# Patient Record
Sex: Female | Born: 1960 | Race: Black or African American | Hispanic: No | Marital: Married | Smoking: Never smoker
Health system: Southern US, Community
[De-identification: ages and names within clinical notes are randomized; demographics above are authoritative.]

## PROBLEM LIST (undated history)

## (undated) DIAGNOSIS — E119 Type 2 diabetes mellitus without complications: Secondary | ICD-10-CM

## (undated) DIAGNOSIS — E669 Obesity, unspecified: Secondary | ICD-10-CM

## (undated) DIAGNOSIS — G43909 Migraine, unspecified, not intractable, without status migrainosus: Secondary | ICD-10-CM

## (undated) DIAGNOSIS — F338 Other recurrent depressive disorders: Secondary | ICD-10-CM

## (undated) DIAGNOSIS — M3500 Sicca syndrome, unspecified: Secondary | ICD-10-CM

## (undated) DIAGNOSIS — K589 Irritable bowel syndrome without diarrhea: Secondary | ICD-10-CM

## (undated) DIAGNOSIS — I10 Essential (primary) hypertension: Secondary | ICD-10-CM

## (undated) DIAGNOSIS — G4700 Insomnia, unspecified: Secondary | ICD-10-CM

## (undated) DIAGNOSIS — G35 Multiple sclerosis: Secondary | ICD-10-CM

## (undated) DIAGNOSIS — E785 Hyperlipidemia, unspecified: Secondary | ICD-10-CM

## (undated) DIAGNOSIS — M5030 Other cervical disc degeneration, unspecified cervical region: Secondary | ICD-10-CM

## (undated) HISTORY — PX: LAPAROSCOPIC CHOLECYSTECTOMY: SUR755

## (undated) HISTORY — PX: BREAST EXCISIONAL BIOPSY: SUR124

## (undated) HISTORY — DX: Migraine, unspecified, not intractable, without status migrainosus: G43.909

## (undated) HISTORY — DX: Other recurrent depressive disorders: F33.8

## (undated) HISTORY — PX: AXILLARY LYMPH NODE BIOPSY: SHX5737

## (undated) HISTORY — DX: Obesity, unspecified: E66.9

## (undated) HISTORY — DX: Insomnia, unspecified: G47.00

## (undated) HISTORY — DX: Hyperlipidemia, unspecified: E78.5

## (undated) HISTORY — PX: NECK SURGERY: SHX720

## (undated) HISTORY — PX: HYPERPLASIA TISSUE EXCISION: SHX5201

## (undated) HISTORY — DX: Type 2 diabetes mellitus without complications: E11.9

## (undated) HISTORY — DX: Irritable bowel syndrome, unspecified: K58.9

---

## 1998-11-20 ENCOUNTER — Observation Stay (HOSPITAL_COMMUNITY): Admission: EM | Admit: 1998-11-20 | Discharge: 1998-11-21 | Payer: Self-pay | Admitting: Emergency Medicine

## 2003-01-14 ENCOUNTER — Encounter: Payer: Self-pay | Admitting: Emergency Medicine

## 2003-01-14 ENCOUNTER — Emergency Department (HOSPITAL_COMMUNITY): Admission: EM | Admit: 2003-01-14 | Discharge: 2003-01-14 | Payer: Self-pay | Admitting: Emergency Medicine

## 2003-04-14 ENCOUNTER — Encounter: Payer: Self-pay | Admitting: Specialist

## 2003-04-20 ENCOUNTER — Observation Stay (HOSPITAL_COMMUNITY): Admission: RE | Admit: 2003-04-20 | Discharge: 2003-04-21 | Payer: Self-pay | Admitting: Specialist

## 2003-04-20 ENCOUNTER — Encounter: Payer: Self-pay | Admitting: Specialist

## 2005-11-16 ENCOUNTER — Other Ambulatory Visit: Admission: RE | Admit: 2005-11-16 | Discharge: 2005-11-16 | Payer: Self-pay | Admitting: Cardiology

## 2006-06-07 ENCOUNTER — Ambulatory Visit (HOSPITAL_COMMUNITY): Admission: RE | Admit: 2006-06-07 | Discharge: 2006-06-07 | Payer: Self-pay | Admitting: *Deleted

## 2006-06-07 ENCOUNTER — Encounter (INDEPENDENT_AMBULATORY_CARE_PROVIDER_SITE_OTHER): Payer: Self-pay | Admitting: Specialist

## 2007-03-07 ENCOUNTER — Other Ambulatory Visit: Admission: RE | Admit: 2007-03-07 | Discharge: 2007-03-07 | Payer: Self-pay | Admitting: Internal Medicine

## 2008-12-23 ENCOUNTER — Other Ambulatory Visit: Admission: RE | Admit: 2008-12-23 | Discharge: 2008-12-23 | Payer: Self-pay | Admitting: Internal Medicine

## 2010-10-19 ENCOUNTER — Other Ambulatory Visit
Admission: RE | Admit: 2010-10-19 | Discharge: 2010-10-19 | Payer: Self-pay | Source: Home / Self Care | Admitting: Family Medicine

## 2011-03-23 NOTE — Op Note (Signed)
NAMETORAH, PINNOCK                         ACCOUNT NO.:  [id number]   MEDICAL RECORD NO.:  [id number]                   PATIENT TYPE:  OBV   LOCATION:  2550                                 FACILITY:  [facility]   PHYSICIAN:  Kerrin Champagne, M.D.                DATE OF BIRTH:  May 14, 1961   DATE OF PROCEDURE:  04/20/2003  DATE OF DISCHARGE:                                 OPERATIVE REPORT   PREOPERATIVE DIAGNOSIS:  Central and left-sided herniated nucleus pulposus,  C5-C6 and C6-C7.   POSTOPERATIVE DIAGNOSIS:  Central and left-sided herniated nucleus pulposus,  C5-C6 and C6-C7.   PROCEDURE:  Anterior cervical diskectomy and fusion at C5-C6 and C6-C7  utilizing right iliac crest bone graft harvested through a separate  incision, internal fixation using a DePuy 43-mm locking plate with 60-AV  screws.  Microscope used during the procedure for diskectomy and excision of  herniated nucleus pulposus at C6-C7 and C5-C6.   SURGEON:  Kerrin Champagne, M.D.   ASSISTANT:  Wende Neighbors, P.A.   ANESTHESIA:  GOT, Dr. Randa Evens.   ESTIMATED BLOOD LOSS:  75 cc.   DRAIN:  TLS 10-French, left neck, Foley to straight drain.   BRIEF CLINICAL HISTORY:  This patient is a 50 year old right-handed female  who has been experiencing discomfort in her neck and radiation into her left  arm since early March of this year.  She has been taking anti-inflammatory  agents and presented to the emergency room with complaints of increasing  neck and radiation into the left arm.  She has undergone extensive  conservative management and has required use of narcotic medications to  relieve her pain.  She has undergone evaluation with MRI study which  demonstrates disk protrusion at C5-C6 and at C6-C7, with evidence of slight  canal stenosis and flattening of the ventral cord at the C5-C6 level, AP  diameter at 8 mm, as well as disk protrusion of the C6-C7 level with  extension to the left side.  The patient is  brought to the operating room to  undergo anterior diskectomy with fusion with right-sided bone graft and  internal fixation.   INTRAOPERATIVE FINDINGS:  The patient was found to have a central and left-  sided disk herniation at the C6-C7 with diffuse disk protrusion at C5-C6,  primarily uncovertebral spurring causing impingement on the cervical canal  at this segment.  She underwent decompression of both segments without  difficulty.   DESCRIPTION OF PROCEDURE:  After adequate general anesthesia, the patient in  the beach chair position, standard preoperative antibiotic, 5 pounds  cervical halter traction, and a transverse roll at the level of the shoulder  blades, with the neck in slight extension.  The right iliac crest with a  bump under the right buttock.  Draped in the usual manner following DuraPrep  to the neck and right iliac crest.  She was given standard  preoperative  antibiotics.  The incision in the anterior left neck at the expected C6-C7  level 2 fingerbreadths above the medial border of the clavicle in this  patient with an apparent short neck.  The incision made in line with the  skin crease.  Approximately 3-3.5 inches in length through the skin and  subcutaneous layers after infiltration with Marcaine 0.5% with 1:200,000  epinephrine.  Incision carried sharply to the preplatysma layer fascia.  This was then incised in line with the skin incision and continued through  the platysma layer through the deep fascial layer.  A Metzenbaum scissors  then used to carefully spread soft tissues and fascial layers to the  interval between the trachea and esophagus laterally.  Finger dissection  then used to carefully spread between the trachea and esophagus medially,  carotid sheath laterally to the anterior aspect of the cervical spine.  Hand-  held Cloward inserted, and the anterior cervical spine identified.  The  prevertebral fascia incised along the medial border of the  longus colli  muscle using electrocautery, then across the midline using Barista.   Spinal needles were then placed at the expected C5-C6 and C6-C7 levels.  Intraoperative C-arm fluoroscopy used to localize this area and demonstrated  the needles at C6-C7 and at C7-T1.  So, the lower needle was removed under  direct observation and replaced at the next level up, the expected C5-C6  level, and this was verified on C-arm fluoroscopy.  With this done, hand-  held Clowards were continued to be used for anterior exposure.  The lower  needle was then removed, and a small portion of the disk excised using a 15  blade scalpel and pituitary rongeur.  Then, the upper disk also had removal  of needle and then a small portion of the disk excised for continued  identification at this level.  With this then, the medial border of the  longus colli muscle was carefully freed up bilaterally using a Key elevator  and Bovie electrocautery used to control bleeding.  A McCullough retractor  then with an articulated portion to the self-distraction portion of the  system was then inserted, and the medial foot placed beneath the medial  border of the longus colli muscle on both sides to allow for exposure of the  anterior aspect of the cervical spine and protection of the esophagus.  This  was done first at the C6-C7 level.  A 15 blade scalp used to excise the  remaining anterior annular fibers and then pituitary rongeurs used to  further remove disk from the anterior two thirds of the disk space.  A 14-mm  screws were then inserted into the vertebral body of C6 and C7, distraction  obtained across the intervertebral disk space.  Under loupe magnification,  then the disk space was debrided further of loose degenerative disk material  as well as the cartilaginous endplates of the inferior aspect of C6,  superior aspect of C7.  This was debrided back to the posterior aspect of the disk space.  The  operating room microscope draped sterilely and brought  into the field.  Under direct visualization, then the posterior aspect of  the disk space was debrided of annular disk material.  The posterior lip  osteophytes were resected using 1 and 2-mm Kerrisons.  The posterior  longitudinal ligament identified centrally with disk material extending into  the left side.  This was resected and then the posterior longitudinal  ligament resected.  Disk  material found within the spinal canal on the left  side and central.  This was excised using pituitary rongeurs as well as  titanium nerve hook.  Once this was completed, then the spinal canal was  carefully examined, demonstrated no further impingement on the left side of  the cord or centrally, and the neural foramen appeared to be wide open.  Height of the intervertebral disk space was sounded using an 8-mm sounder.  Depth measuring 14 mm. Bone graft was harvested through a separate incision  over the right iliac crest using a 10 blade scalpel and with that an  approximately 3.5-4 inch incision was made through the skin and subcutaneous  layers, electrocautery used to control bleeders.  This was carried down to  the anterolateral aspect of the iliac crest.  Self-retaining retractor  inserted.  Subperiosteal dissection carried both medial and lateral to the  iliac crest laterally.  The Cobb elevators left in place.  A dual  oscillating saw was then used to cut a portion of the bone graft,  tricortical iliac crest bone graft, using a 9-mm graft size.  This was then  divided across its base, carefully shaped to the dimensions of the  intervertebral disk space.  A J-graft was used at the C6-C7 level.  The  graft depth approximately 13-14 mm and height of approximately 9 mm.  After  carefully tethering this to the dimension of the intervertebral disk space,  the endplates carefully examined, there was no loose material within the  disk space, the  graft placed over the disk space and packed into placed  without difficulty.  Distraction then released across the intervertebral  disk space and the screw post removed at the C7 level.  The retractor was  then moved superiorly beneath the medial border of the longus colli muscle,  replaced at the foot of the retractor on each side.   Hand-held Cloward then used for traction of the superior structures to allow  for exposure of the vertebral body of C5.  Carefully, the soft tissues over  the anterior aspect of the cervical spine were debrided using electrocautery  at C5, C6 and C7 to allow for exposure of the bone here.  Then 14-mm screw  posts were then inserted in the C5 vertebral body parallel to that of  C6,  and distraction obtained across the intervertebral disk space.   The anterior aspect of the disk space was then debrided using pituitary  rongeurs and microcurettes, debriding the cartilaginous endplates both superiorly and inferiorly at the C5-C6 level.  High-speed bur used to  similarly debride the endplates back to the posterior aspect of the disk  space.  The operating room microscope used for the majority of this portion  of the procedure as it was already in place as well.   Posterior annular tissue resected using pituitary rongeurs as well as 1 and  2-mm Kerrisons.  Posterior lip osteophytes resected both superiorly and  inferiorly using 1 and 2-mm Kerrisons and foraminotomy performed on the left  side.  With this, the disk space appeared to be well decompressed.  Irrigation was performed.  The height of the intervertebral disk space was  measuring, measuring about 8 mm, and the depth approximately 18 mm.  Additional bone graft was harvested through the right iliac crest incision  site by continuing the incision more posteriorly.  Using again the 9-mm with  dual oscillating blade, this was cut, then divided across the base with a  1/4-inch curved osteotome.  Bone wax than  applied to the cancellous bone  surfaces at the bone graft harvest site for hemostasis purposes.  This area  then packed.  Bone graft had a height of approximately 9 mm.  This was  carefully trimmed and keyed to allow for insertion at the disk space with a  correct fit.  Depth of the bone graft measuring about 13-14 mm.  The graft  carefully debrided of any soft tissue attachments.  The disk space then  carefully irrigated, graft placed over the disk space, impacted into place.  Care was taken to assure there was no loose debris within the disk space  that could be retropulsed with insertion of the graft.  The graft subset  approximately 1-2 mm at this level.  With this, then distraction was  released across the intervertebral disk space, 5 pounds cervical halter  traction released, screw posts removed at both C5 and C6, bone wax applied  to the bleeding screw post holes.  Careful hemostasis obtained using a  bipolar electrocautery.  A single thyroid branch artery at the initiation of  the case actually had been ligated. With exposure, this continued to do well  throughout the case.   High-speed bur was then used to remove the anterior lip osteophytes at C5-C6  and C6-C7, carefully smoothing the anterior aspect of the cervical spine to  allow for the plate to anneal carefully against the anterior bony surfaces.  A 43-mm length plate was chosen bridging each of the disk spaces and yet not  impinging on either the disk space above or below the fusion site.  With  this, the plate was carefully aligned in the sagittal plane then pinned in  place using the temporary fixation pins superiorly and inferiorly.  Drill  holes were then first placed at the C7 level, left side, then the right  side, following placement of 14-mm screw on the right side.  The left side  was drilled and then similarly placed.  Then at the C6 level, the right and  then left side also similarly drilled in this fashion, and  the appropriate sized 14-mm screw placed.  These all obtained excellent purchase, and then  at the C5 level, similar drilling was performed and then 14-mm screws used  for fixation of the plate at the C5 level.  With this, then the fixation  pins were removed.  The locking nuts were then carefully turned to lock the  screws into the plate.  Irrigation was performed, and inspection of the  esophagus demonstrated no abnormality.  C-arm images were obtained in the  lateral plane demonstrating the bone plugs and plate in good position and  alignment.  No evidence of retropulsion of bone and no evidence of screw  extension into the spinal canal.  With this, inspection of the neck, there  was no active bleeding.  However, it was felt that a 10-French drain was  appropriate to prevent any development of hematoma here.  This was placed in  the depths of the incision exiting inferior to the incision over the  anterior neck.  The platysma layer was approximated with interrupted 3-0  Vicryl suture, deep subcutaneous layers with interrupted 3-0 Vicryl suture,  and the skin closed with a running subcuticular stitch of 4-0 Vicryl.  The  right iliac crest bone graft harvest site was carefully irrigated, bone wax  again applied to the bleeding cancellous bone surface, excess bone wax  removed, and Gelfoam  applied.  The periosteal layer, the abdominal fascia,  and the anterior thigh fascia then approximated over the iliac crest with  interrupted #1 Vicryl suture, the deep subcutaneous layers approximated with  interrupted #1 and 0 Vicryl sutures, more superficial layers with  interrupted 2-0 Vicryl sutures, and the skin closed with a running  subcuticular stitch of 4-0 Vicryl.  Tincture of Benzoin and Steri-Strips  applied, 4 x 4's affixed to the skin with Hypofix tape.  The 10-French drain  in the neck was sewn in place using a 4-0 nylon suture.  Dressing then  fixed to the neck following application of  Steri-Strips and Tincture of  Benzoin to this area, 4 x 4 affixed to the skin with Hypofix tape.  Phil[place] collar was then applied.  The patient then reactivated,  extubated, and returned to the recovery room in satisfactory condition.  All  instrument and sponge counts were correct.                                               Kerrin Champagne, M.D.    JEN/MEDQ  D:  04/20/2003  T:  04/20/2003  Job:  [id number]

## 2011-03-23 NOTE — Op Note (Signed)
Linda Beltran, Linda Beltran               ACCOUNT NO.:  [id number]   MEDICAL RECORD NO.:  [id number]          PATIENT TYPE:  AMB   LOCATION:  DAY                          FACILITY:  [facility]   PHYSICIAN:  Alfonse Ras, MD   DATE OF BIRTH:  [birth year]   DATE OF PROCEDURE:  06/07/2006  DATE OF DISCHARGE:                                 OPERATIVE REPORT   PREOPERATIVE DIAGNOSIS:  Left axillary lymph node enlargement.   POSTOPERATIVE DIAGNOSIS:  Left axillary lymph node enlargement.   PROCEDURE:  Left axillary lymph node biopsy.   ANESTHESIA:  Local MAC.   DESCRIPTION:  The patient was taken to the operating room and placed in a  supine position. After the left axilla was prepped and draped in normal  sterile fashion, I injected 1% lidocaine local anesthesia into the area  overlying the palpable adenopathy. A transverse incision was made. I  dissected down onto enlarged lymph nodes. These were incised in their  entirety and sent for pathologic evaluation. Adequate hemostasis was  ensured, and the skin was closed with subcutaneous 4-0 Monocryl. Steri-  Strips and sterile dressing were applied. The patient tolerated the  procedure well and went to PACU in good condition.      Alfonse Ras, MD  Electronically Signed     KRE/MEDQ  D:  06/07/2006  T:  06/08/2006  Job:  [id number]

## 2013-11-27 ENCOUNTER — Other Ambulatory Visit (HOSPITAL_COMMUNITY)
Admission: RE | Admit: 2013-11-27 | Discharge: 2013-11-27 | Disposition: A | Discharge status: Home / Self Care | Payer: Commercial Indemnity | Source: Ambulatory Visit | Attending: Family Medicine | Admitting: Family Medicine

## 2013-11-27 ENCOUNTER — Other Ambulatory Visit: Payer: Self-pay | Admitting: Family Medicine

## 2013-11-27 DIAGNOSIS — Z1151 Encounter for screening for human papillomavirus (HPV): Secondary | ICD-10-CM | POA: Insufficient documentation

## 2013-11-27 DIAGNOSIS — Z124 Encounter for screening for malignant neoplasm of cervix: Secondary | ICD-10-CM | POA: Insufficient documentation

## 2017-08-23 ENCOUNTER — Other Ambulatory Visit (HOSPITAL_COMMUNITY)
Admission: RE | Admit: 2017-08-23 | Discharge: 2017-08-23 | Disposition: A | Discharge status: Home / Self Care | Payer: Managed Care, Other (non HMO) | Source: Ambulatory Visit | Attending: Family Medicine | Admitting: Family Medicine

## 2017-08-23 ENCOUNTER — Other Ambulatory Visit: Payer: Self-pay | Admitting: Family Medicine

## 2017-08-23 ENCOUNTER — Encounter: Payer: Self-pay | Admitting: Neurology

## 2017-08-23 DIAGNOSIS — Z01411 Encounter for gynecological examination (general) (routine) with abnormal findings: Secondary | ICD-10-CM | POA: Diagnosis present

## 2017-08-27 LAB — CYTOLOGY - PAP
Diagnosis: NEGATIVE
HPV: NOT DETECTED

## 2017-09-02 ENCOUNTER — Other Ambulatory Visit: Payer: Self-pay | Admitting: Family Medicine

## 2017-09-02 DIAGNOSIS — Z1231 Encounter for screening mammogram for malignant neoplasm of breast: Secondary | ICD-10-CM

## 2017-09-20 ENCOUNTER — Encounter: Payer: Self-pay | Admitting: Radiology

## 2017-09-20 ENCOUNTER — Ambulatory Visit
Admission: RE | Admit: 2017-09-20 | Discharge: 2017-09-20 | Disposition: A | Discharge status: Home / Self Care | Payer: Managed Care, Other (non HMO) | Source: Ambulatory Visit | Attending: Family Medicine | Admitting: Family Medicine

## 2017-09-20 DIAGNOSIS — Z1231 Encounter for screening mammogram for malignant neoplasm of breast: Secondary | ICD-10-CM

## 2017-09-23 ENCOUNTER — Other Ambulatory Visit: Payer: Self-pay | Admitting: Family Medicine

## 2017-09-23 DIAGNOSIS — R928 Other abnormal and inconclusive findings on diagnostic imaging of breast: Secondary | ICD-10-CM

## 2017-10-03 ENCOUNTER — Ambulatory Visit: Payer: Managed Care, Other (non HMO)

## 2017-10-03 ENCOUNTER — Ambulatory Visit
Admission: RE | Admit: 2017-10-03 | Discharge: 2017-10-03 | Disposition: A | Discharge status: Home / Self Care | Payer: Managed Care, Other (non HMO) | Source: Ambulatory Visit | Attending: Family Medicine | Admitting: Family Medicine

## 2017-10-03 DIAGNOSIS — R928 Other abnormal and inconclusive findings on diagnostic imaging of breast: Secondary | ICD-10-CM

## 2017-10-03 IMAGING — MG 2D DIGITAL DIAGNOSTIC UNILATERAL RIGHT MAMMOGRAM WITH CAD AND AD
6 series · 6 of 14 positions shown · non-contrast
Comparison: [DATE]

CLINICAL DATA: 55-year-old patient recalled from recent 3D
screening mammogram for evaluation of an asymmetry identified in the
CC view of the right breast.

EXAM:
2D DIGITAL DIAGNOSTIC UNILATERAL RIGHT MAMMOGRAM WITH CAD AND
ADJUNCT TOMO

[R ML synth-2D]
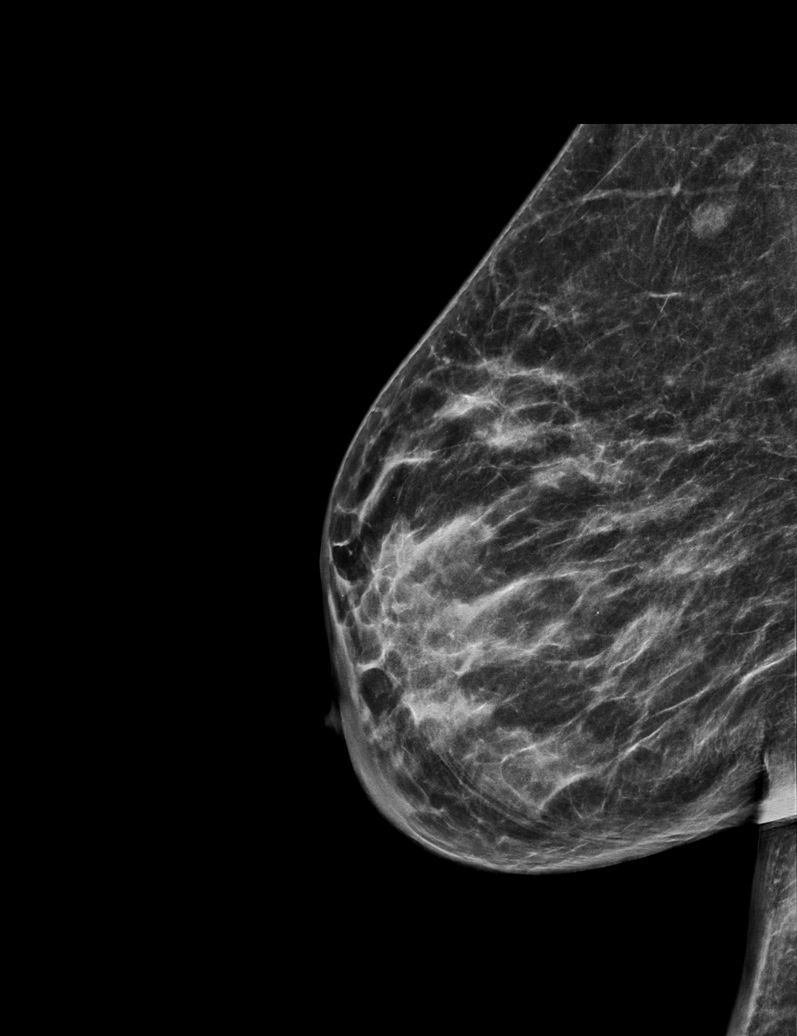

[R CC synth-2D]
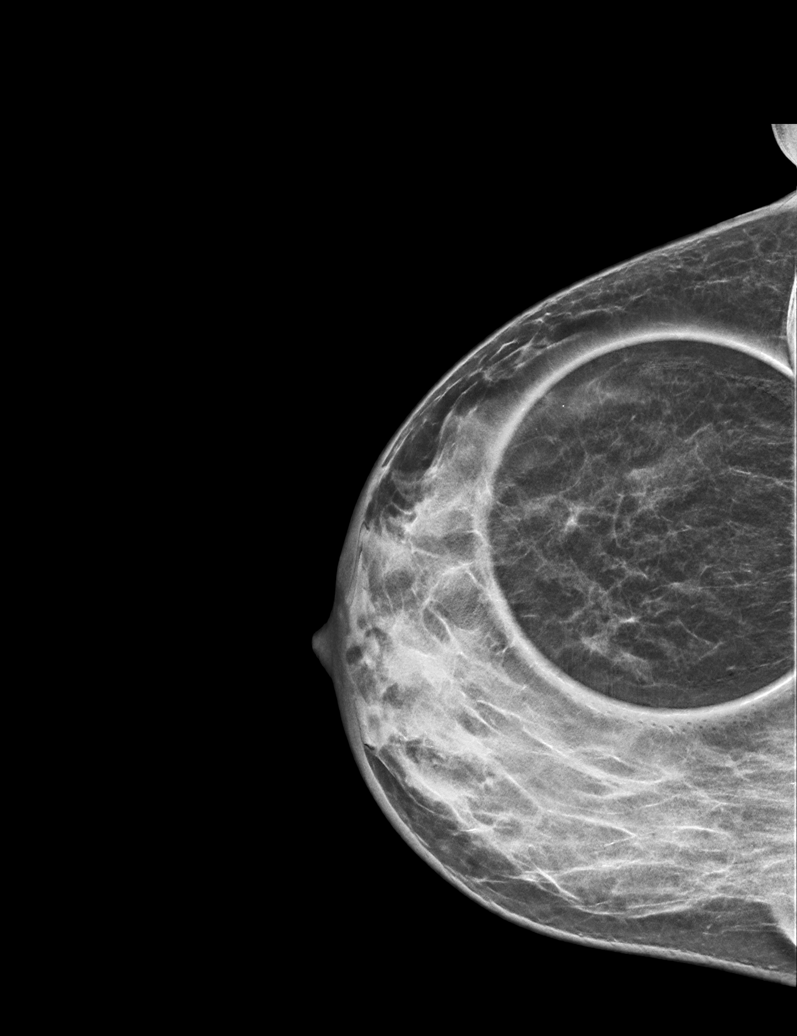

[R CC]
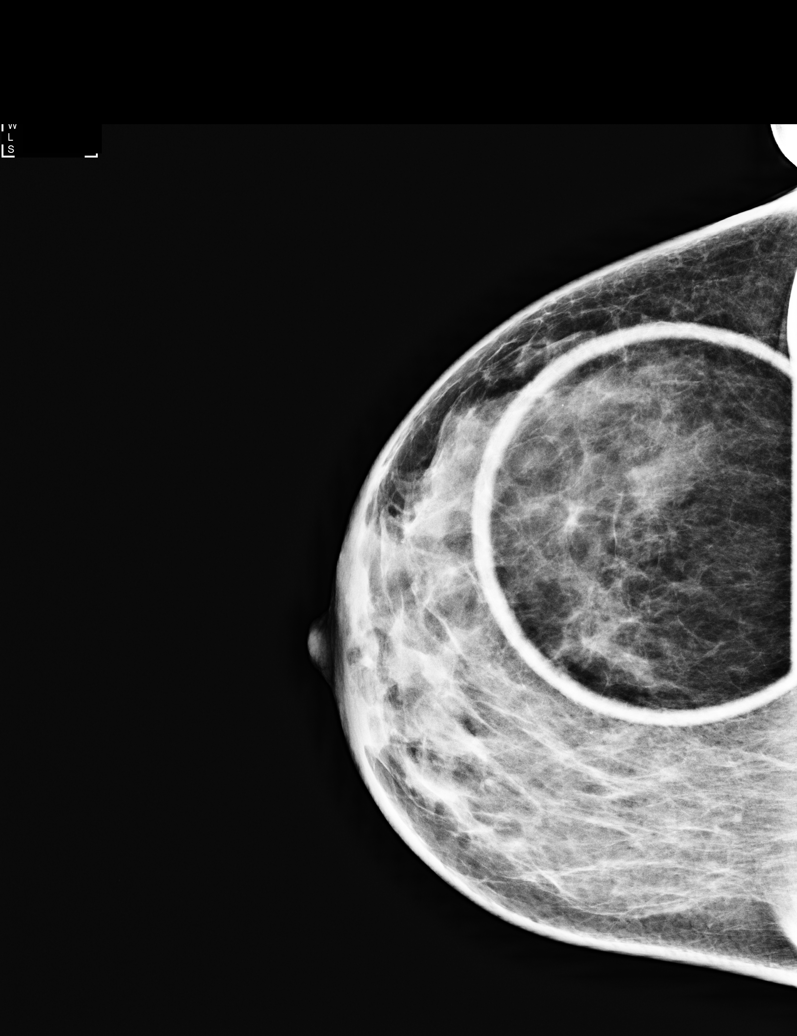

[R ML]
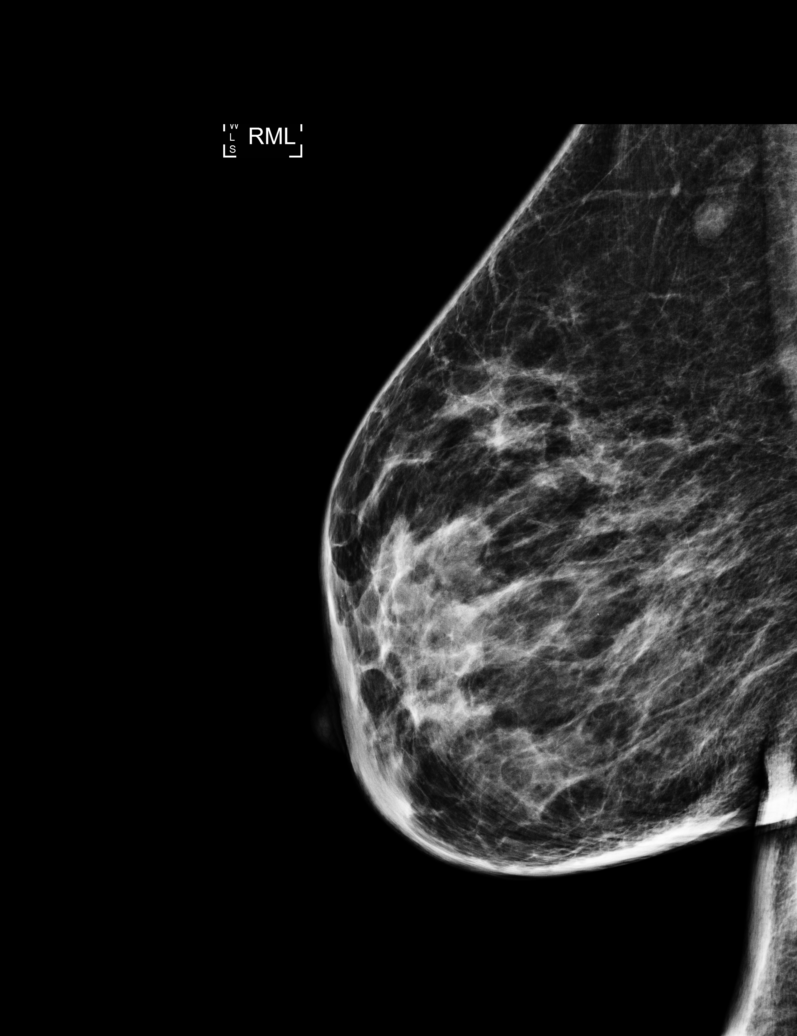

[R ML tomo · tomo slice 27/54.0]
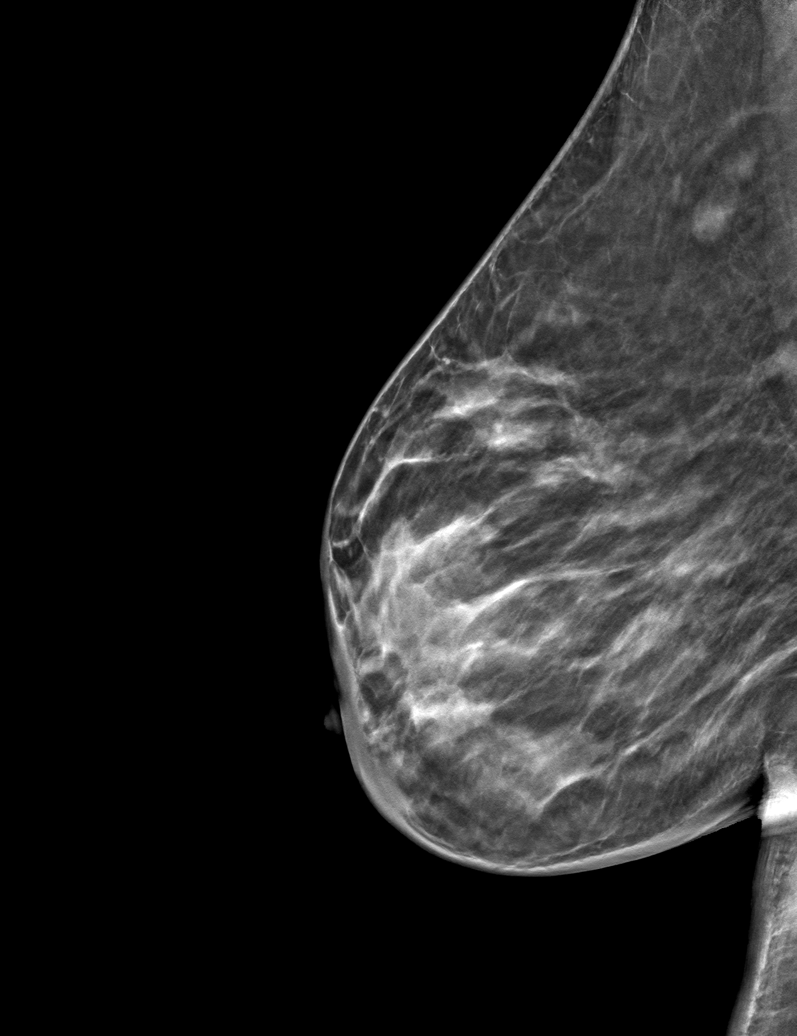

[R CC tomo · tomo slice 21/40.0]
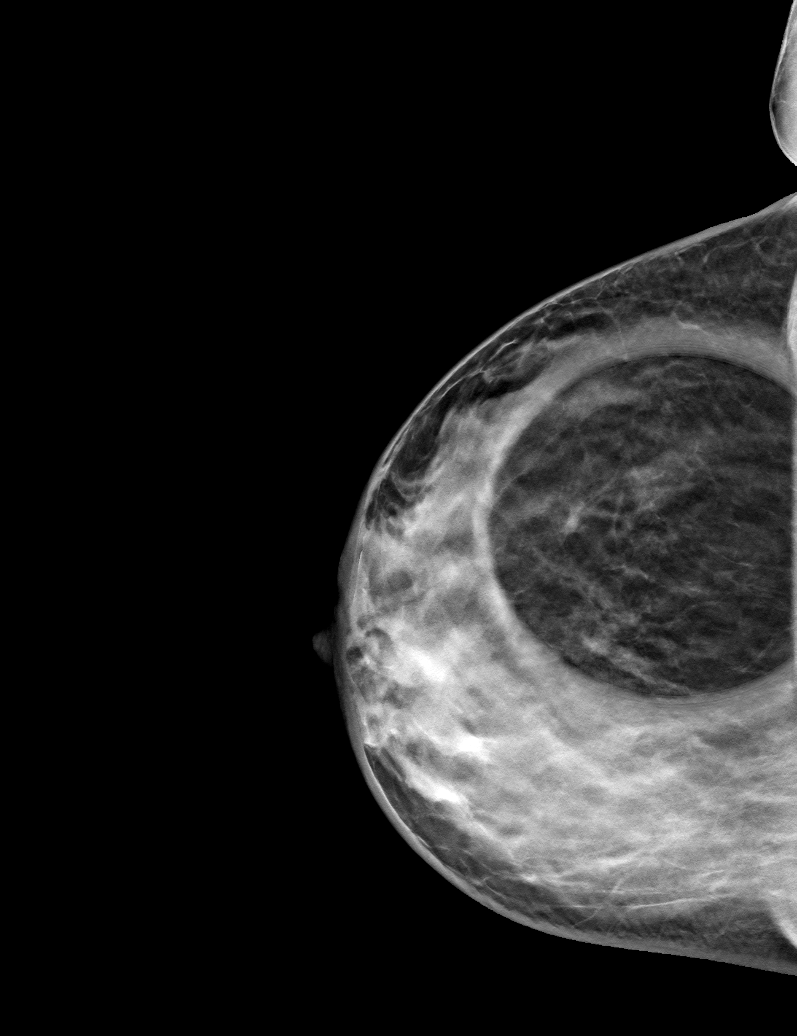

[6 of 14 positions shown; findings below may reference images not displayed]

ACR Breast Density Category c: The breast tissue is heterogeneously
dense, which may obscure small masses.
FINDINGS: Focal spot compression view of the outer right breast with
tomography show normal fibroglandular tissue in the outer right
breast without mass or distortion. A 90 degree lateral view of the
right breast with tomography is negative for mass or distortion. No
suspicious findings on today's additional images.

Mammographic images were processed with CAD.
IMPRESSION: No evidence of malignancy in the right breast.

RECOMMENDATION:
Screening mammogram in one year.(Code:[H7])

I have discussed the findings and recommendations with the patient.
Results were also provided in writing at the conclusion of the
visit. If applicable, a reminder letter will be sent to the patient
regarding the next appointment.

BI-RADS CATEGORY  1: Negative.

## 2017-10-22 ENCOUNTER — Encounter: Payer: Self-pay | Admitting: Neurology

## 2017-10-22 ENCOUNTER — Ambulatory Visit (INDEPENDENT_AMBULATORY_CARE_PROVIDER_SITE_OTHER): Payer: Managed Care, Other (non HMO) | Admitting: Neurology

## 2017-10-22 VITALS — BP 110/70 | HR 57 | Ht 62.0 in | Wt 139.0 lb

## 2017-10-22 DIAGNOSIS — R51 Headache: Secondary | ICD-10-CM | POA: Diagnosis not present

## 2017-10-22 DIAGNOSIS — G43119 Migraine with aura, intractable, without status migrainosus: Secondary | ICD-10-CM

## 2017-10-22 DIAGNOSIS — R519 Headache, unspecified: Secondary | ICD-10-CM | POA: Insufficient documentation

## 2017-10-22 MED ORDER — TOPIRAMATE ER 100 MG PO SPRINKLE CAP24: 1.0000 | EXTENDED_RELEASE_CAPSULE | Freq: Every day | ORAL | 0 refills | Status: DC

## 2017-10-22 MED ORDER — ZOLMITRIPTAN 2.5 MG NA SOLN: 1.0000 | Freq: Every day | NASAL | 0 refills | Status: DC | PRN

## 2017-10-22 NOTE — Patient Instructions (Addendum)
1.  Start nortriptyline 10mg  at bedtime for 2 week, then increase to 2 tablet at bedtime 2.  Continue topiramate 200mg  at bedtime.  We will start prior authorization for extended release formula 3.  For severe migraine, zomig nasal spray 4.  Start magnesium 400mg  daily  Send me a MyChart message in 3 weeks to let me know how you are doing  Return to clinic in 4 months

## 2017-10-22 NOTE — Progress Notes (Signed)
[facility] Clinic Note - Initial Visit   Date: 10/22/17  Linda Beltran MRN: [id number] DOB: [birth year]   Dear Dr. Hyman HopesWebb:  Thank you for your kind referral of Linda Beltran for consultation of migraines. Although her history is well known to you, please allow us to reiterate it for the purpose of our medical record. The patient was accompanied to the clinic by self.    History of Present Illness: Linda Beltran is a 56 y.o. right-handed African American female with hypertension and hyperlipidemia presenting for evaluation of migraines.    She has long history migraines, which started in her teens.  Headaches would occur about once per month and treated with tylenol.  In her 3820s, headache frequency and intensity started getting worse.  Now, she has throbbing/piercing pain over her entire head.  Headaches occur daily, lasting 24 hours.  She does not recall her last headache free day.  She has not identified any headache triggers.  She has associated nausea, vomiting, and photophobia.  She occasionally has visual shining lights. Her daily headaches are ranked as 7/10 and migraines are usually 10/10.  She continues to be functional and has not missed any days from work-related to her headaches.   She takes topiramate 200mg /d for the past year, which has reduced the intensity, but complaints or left hand tingling over the fingertips.  Recently, she started fioricet about 3 times per week which dulls her pain some.  She has tried amitriptyline, relpax, maxalt, and tramadol in the past. She has not seen a neurologist.  She had MRI brain in her 3520s which was normal.    Out-side paper records, electronic medical record, and images have been reviewed where available and summarized as:  Labs 08/02/2017:  Ferritin 93, TSH 1.40, CMP normal Cr 0.98,   Past Medical History:  Diagnosis Date  . Diabetes (HCC)   . Hyperlipidemia   . IBS (irritable bowel syndrome)   .  Insomnia   . Migraine   . Obesity   . Seasonal depression (HCC)     Past Surgical History:  Procedure Laterality Date  . AXILLARY LYMPH NODE BIOPSY    . BREAST EXCISIONAL BIOPSY Left   . CESAREAN SECTION    . HYPERPLASIA TISSUE EXCISION    . LAPAROSCOPIC CHOLECYSTECTOMY    . NECK SURGERY       Medications:  Outpatient Encounter Medications as of 10/22/2017  Medication Sig  . aspirin EC 81 MG tablet Take 81 mg by mouth daily.  . Butalbital-APAP-Caffeine 50-300-40 MG CAPS   . lisinopril-hydrochlorothiazide (PRINZIDE,ZESTORETIC) 20-12.5 MG tablet   . Metoprolol Succinate 50 MG CS24 Take by mouth.  . Multiple Vitamins-Calcium (ONE-A-DAY WOMENS PO) Take by mouth.  Marland Kitchen. omeprazole (PRILOSEC) 40 MG capsule Take 40 mg by mouth daily.  Marland Kitchen. tiZANidine (ZANAFLEX) 4 MG tablet Take 4 mg by mouth every 6 (six) hours as needed for muscle spasms.  Marland Kitchen. topiramate (TOPAMAX) 100 MG tablet   . traMADol (ULTRAM) 50 MG tablet Take by mouth every 6 (six) hours as needed.   No facility-administered encounter medications on file as of 10/22/2017.      Allergies:  Allergies  Allergen Reactions  . Amitriptyline   . Codeine   . Latex   . Neosporin [Neomycin-Bacitracin Zn-Polymyx]     Family History: Her father was killed in a MVA. Mother died with metastatic cancer. She has one sister who died of complications of diabetes and alcoholism No family history of migraines.  Social History: Social History   Tobacco Use  . Smoking status: Never Smoker  . Smokeless tobacco: Never Used  Substance Use Topics  . Alcohol use: No    Frequency: Never  . Drug use: No   Social History   Social History Narrative   Lives with husband in a one story home.  Has 2 children.  Works as a Associate Professor.  Education: some college.     Review of Systems:  CONSTITUTIONAL: No fevers, chills, night sweats, or weight loss.   EYES: No visual changes or eye pain ENT: No hearing changes.  No history of nose bleeds.    RESPIRATORY: No cough, wheezing and shortness of breath.   CARDIOVASCULAR: Negative for chest pain, and palpitations.   GI: Negative for abdominal discomfort, blood in stools or black stools.  No recent change in bowel habits.   GU:  No history of incontinence.   MUSCLOSKELETAL: No history of joint pain or swelling.  No myalgias.   SKIN: Negative for lesions, rash, and itching.   HEMATOLOGY/ONCOLOGY: Negative for prolonged bleeding, bruising easily, and swollen nodes.  No history of cancer.   ENDOCRINE: Negative for cold or heat intolerance, polydipsia or goiter.   PSYCH:  No depression or anxiety symptoms.   NEURO: As Above.   Vital Signs:  BP 110/70   Pulse (!) 57   Ht 5\' 2"  (1.575 m)   Wt 139 lb (63 kg)   SpO2 98%   BMI 25.42 kg/m    General Medical Exam:   General:  Well appearing, comfortable.   Eyes/ENT: see cranial nerve examination.   Neck: No masses appreciated.  Full range of motion without tenderness.  No carotid bruits. Respiratory:  Clear to auscultation, good air entry bilaterally.   Cardiac:  Regular rate and rhythm, no murmur.   Extremities:  No deformities, edema, or skin discoloration.  Skin:  No rashes or lesions.  Neurological Exam: MENTAL STATUS including orientation to time, place, person, recent and remote memory, attention span and concentration, language, and fund of knowledge is normal.  Speech is not dysarthric.  CRANIAL NERVES: II:  No visual field defects.  Unremarkable fundi.   III-IV-VI: Pupils equal round and reactive to light.  Normal conjugate, extra-ocular eye movements in all directions of gaze.  No nystagmus.  No ptosis.   V:  Normal facial sensation.     VII:  Normal facial symmetry and movements.  No pathologic facial reflexes.  VIII:  Normal hearing and vestibular function.   IX-X:  Normal palatal movement.   XI:  Normal shoulder shrug and head rotation.   XII:  Normal tongue strength and range of motion, no deviation or  fasciculation.  MOTOR: Motor strength is 5/5 throughout.  No atrophy, fasciculations or abnormal movements.  No pronator drift.  Tone is normal.    MSRs:  Right                                                                 Left brachioradialis 2+  brachioradialis 2+  biceps 2+  biceps 2+  triceps 2+  triceps 2+  patellar 2+  patellar 2+  ankle jerk 2+  ankle jerk 2+  Hoffman no  Hoffman no  plantar response down  plantar response  down   SENSORY:  Normal and symmetric perception of light touch, pinprick, vibration, and proprioception.  COORDINATION/GAIT: Normal finger-to- nose-finger  Intact rapid alternating movements bilaterally.  Gait narrow based and stable. Tandem and stressed gait intact.    IMPRESSION: 1.  Chronic daily headaches.  Long history of migraines since teens which transitioned in to daily headaches in her 63s and over the past few years, pain has intensified.  Exam is non-focal.  No worrisome findings on history or exam to warrant imaging.  - Previously tried:  Amitriptyline  - Because she is tolerating topiramate well, I will transition her to Quedexy 200mg /d to see if headaches can be better controlled on an extended-release formula and minimize side effects of paresthesias.   - Add nortriptyline 10mg  at bedtime for 2 week, then increase to 2 tablet at bedtime  - Start magnesium 400mg /d for headache preventation  - Limit fioricet to 2 days per month, if not stop all together due to high propensity to develop rebound headaches  - Going forward, she would be a candidate for botox or newer CGRP receptor antagonists  2.  Episodic migraine with aura  - Previously tried:  Relpax, maxalt  - Samples provided for Zomig 2.5mg  nasal spray for abortive therapy.  If effective, will send prescription  She was asked to send me a MyChart message with an update in 3 weeks  Return to clinic in 4 months.   Thank you for allowing me to participate in patient's care.  If I can  answer any additional questions, I would be pleased to do so.    Sincerely,    Jenevieve Kirschbaum K. Allena Katz, DO

## 2017-10-31 ENCOUNTER — Encounter: Payer: Self-pay | Admitting: Neurology

## 2017-10-31 MED ORDER — NORTRIPTYLINE HCL 10 MG PO CAPS: ORAL_CAPSULE | ORAL | 5 refills | Status: DC

## 2017-11-22 ENCOUNTER — Ambulatory Visit: Payer: Commercial Indemnity | Admitting: Neurology

## 2018-02-10 ENCOUNTER — Encounter: Payer: Self-pay | Admitting: Neurology

## 2018-02-10 ENCOUNTER — Ambulatory Visit (INDEPENDENT_AMBULATORY_CARE_PROVIDER_SITE_OTHER): Payer: Managed Care, Other (non HMO) | Admitting: Neurology

## 2018-02-10 VITALS — BP 104/70 | HR 77 | Ht 62.0 in | Wt 136.2 lb

## 2018-02-10 DIAGNOSIS — H538 Other visual disturbances: Secondary | ICD-10-CM

## 2018-02-10 DIAGNOSIS — R519 Headache, unspecified: Secondary | ICD-10-CM

## 2018-02-10 DIAGNOSIS — R51 Headache: Secondary | ICD-10-CM | POA: Diagnosis not present

## 2018-02-10 DIAGNOSIS — G43119 Migraine with aura, intractable, without status migrainosus: Secondary | ICD-10-CM

## 2018-02-10 MED ORDER — GABAPENTIN 300 MG PO CAPS: ORAL_CAPSULE | ORAL | 5 refills | Status: DC

## 2018-02-10 MED ORDER — DIHYDROERGOTAMINE MESYLATE 4 MG/ML NA SOLN: 1.0000 | NASAL | 12 refills | Status: DC | PRN

## 2018-02-10 NOTE — Progress Notes (Signed)
Follow-up Visit   Date: 02/10/18    Linda Beltran MRN: [id number] DOB: August 31, 1961   Interim History: Linda Beltran is a 57 y.o. right-handed African American female hypertension and hyperlipidemia returning to the clinic for follow-up of migraines.  The patient was accompanied to the clinic by self.   History of present illness: She has long history migraines, which started in her teens.  Headaches would occur about once per month and treated with tylenol.  In her 31s, headache frequency and intensity started getting worse.  Now, she has throbbing/piercing pain over her entire head.  Headaches occur daily, lasting 24 hours.  She does not recall her last headache free day.  She has not identified any headache triggers.  She has associated nausea, vomiting, and photophobia.  She occasionally has visual shining lights. Her daily headaches are ranked as 7/10 and migraines are usually 10/10.  She continues to be functional and has not missed any days from work-related to her headaches.   She takes topiramate 200mg /d for the past year, which has reduced the intensity, but complaints or left hand tingling over the fingertips.  Recently, she started fioricet about 3 times per week which dulls her pain some.  She has tried amitriptyline, relpax, maxalt, and tramadol in the past. She has not seen a neurologist.  She had MRI brain in her 45s which was normal.    UPDATE 02/10/2018:  She here is here for follow-up visit.  Unfortunately, there has been no improvement in her headaches despite adding nortriptyline 20mg  qhs, which is giving her dry mouth. There was no relief with zomig.   She is having daily headaches and takes fioricet about once per week.  She is getting migraines about 4 times per week.  She also complains of insomnia and feels that if she could rest, headaches would be better.  For the last 2 months, she also complains blurry vision.  There is no eye pain or double vision.  She has  not been evaluated by her eye doctor.  Medications:  Current Outpatient Medications on File Prior to Visit  Medication Sig Dispense Refill  . aspirin EC 81 MG tablet Take 81 mg by mouth daily.    . Butalbital-APAP-Caffeine 50-300-40 MG CAPS     . cyclobenzaprine (FLEXERIL) 10 MG tablet     . lisinopril-hydrochlorothiazide (PRINZIDE,ZESTORETIC) 20-12.5 MG tablet     . Multiple Vitamins-Calcium (ONE-A-DAY WOMENS PO) Take by mouth.    . nortriptyline (PAMELOR) 10 MG capsule Start nortriptyline 10mg  at bedtime for 2 week, then increase to 2 tablet at bedtime 60 capsule 5  . omeprazole (PRILOSEC) 40 MG capsule Take 40 mg by mouth daily.    Marland Kitchen topiramate (TOPAMAX) 100 MG tablet     . TOPROL XL 50 MG 24 hr tablet     . ZOLMitriptan (ZOMIG) 2.5 MG SOLN Place 1 spray into the nose daily as needed. 1 each 0   No current facility-administered medications on file prior to visit.     Allergies:  Allergies  Allergen Reactions  . Amitriptyline   . Codeine   . Latex   . Neosporin [Neomycin-Bacitracin Zn-Polymyx]     Review of Systems:  CONSTITUTIONAL: No fevers, chills, night sweats, or weight loss.  EYES: +visual changes or eye pain ENT: No hearing changes.  No history of nose bleeds.   RESPIRATORY: No cough, wheezing and shortness of breath.   CARDIOVASCULAR: Negative for chest pain, and palpitations.   GI: Negative  for abdominal discomfort, blood in stools or black stools.  No recent change in bowel habits.   GU:  No history of incontinence.   MUSCLOSKELETAL: No history of joint pain or swelling.  No myalgias.   SKIN: Negative for lesions, rash, and itching.   ENDOCRINE: Negative for cold or heat intolerance, polydipsia or goiter.   PSYCH:  + depression or anxiety symptoms.   NEURO: As Above.   Vital Signs:  BP 104/70   Pulse 77   Ht 5\' 2"  (1.575 m)   Wt 136 lb 4 oz (61.8 kg)   SpO2 94%   BMI 24.92 kg/m    General: Tired-appearing, comfortable Neck: No carotid bruit CV:  RRR Ext: No edema  Neurological Exam: MENTAL STATUS including orientation to time, place, person, recent and remote memory, attention span and concentration, language, and fund of knowledge is normal.  Speech is not dysarthric.  CRANIAL NERVES: Normal fundoscopic exam.  No visual field defects. Visual acuity is 20/30 OD, 20/20 OS.  Pupils equal round and reactive to light.  Normal conjugate, extra-ocular eye movements in all directions of gaze.  No ptosis. Normal facial sensation.  Face is symmetric. Palate elevates symmetrically.  Tongue is midline.  MOTOR:  Motor strength is 5/5 in all extremities.  No pronator drift.  Tone is normal.    MSRs:  Reflexes are 2+/4 throughout.  SENSORY:  Intact to vibration throughout  COORDINATION/GAIT:   Gait narrow based and stable.   Data: Labs 08/02/2017:  Ferritin 93, TSH 1.40, CMP normal Cr 0.98   IMPRESSION/PLAN: 1.  Chronic daily headaches.  Long history of migraines since teens which transitioned in to daily headaches in her 72s and over the past few years, pain has intensified.               - Previously tried:  Amitriptyline, toradol injection  - Stop nortriptyline due to dry mouth  - Start gabapentin 300mg  at bedtime x 1 week, then increase to 600mg  at bedtime.  - Continue topiramate 200mg  at bedtime  - Continue magnesium 400mg /d   - Information on Ajovy was provided and I think she would be a good candidate for this.  We also discussed botox injections.  2.  Episodic migraine with aura              - Previously tried:  Relpax, maxalt, zomig   - Samples provides migranal nasal spray  3.  Right eye vision changes  - Recommend follow-up with eye doctor for her acuity changes.   - If eye exam is normal, next step is MRI brain   Return to clinic in 4 months   The duration of this appointment visit was 25 minutes of face-to-face time with the patient.  Greater than 50% of this time was spent in counseling, explanation of diagnosis,  planning of further management, and coordination of care.   Thank you for allowing me to participate in patient's care.  If I can answer any additional questions, I would be pleased to do so.    Sincerely,    Prospero Mahnke K. Allena Katz, DO

## 2018-02-10 NOTE — Patient Instructions (Addendum)
Stop nortriptyline   Continue topiramate 200mg  daily  Start gabapentin 300mg  - Take 1 tablet at bedtime x 1 week, then increase to 2 tablets at bedtime  Samples provided for migranal nasal spray  Please review information Ajovy  Please see your eye doctor for blurred vision  Return to clinic in 4 months

## 2018-08-15 ENCOUNTER — Ambulatory Visit (INDEPENDENT_AMBULATORY_CARE_PROVIDER_SITE_OTHER): Payer: Managed Care, Other (non HMO) | Admitting: Neurology

## 2018-08-15 ENCOUNTER — Encounter: Payer: Self-pay | Admitting: Neurology

## 2018-08-15 VITALS — BP 120/78 | HR 77 | Ht 62.0 in | Wt 157.1 lb

## 2018-08-15 DIAGNOSIS — R519 Headache, unspecified: Secondary | ICD-10-CM

## 2018-08-15 DIAGNOSIS — G43119 Migraine with aura, intractable, without status migrainosus: Secondary | ICD-10-CM

## 2018-08-15 DIAGNOSIS — R51 Headache: Secondary | ICD-10-CM

## 2018-08-15 MED ORDER — DIHYDROERGOTAMINE MESYLATE 4 MG/ML NA SOLN: 1.0000 | NASAL | 12 refills | Status: DC | PRN

## 2018-08-15 NOTE — Progress Notes (Signed)
Follow-up Visit   Date: 08/15/18    Linda Beltran MRN: [id number] DOB: 09/24/1961   Interim History: Linda Beltran is a 57 y.o. right-handed African American female hypertension and hyperlipidemia returning to the clinic for follow-up of migraines.  The patient was accompanied to the clinic by self.   History of present illness: She has long history migraines, which started in her teens.  Headaches would occur about once per month and treated with tylenol.  In her 61s, headache frequency and intensity started getting worse.  Now, she has throbbing/piercing pain over her entire head.  Headaches occur daily, lasting 24 hours.  She does not recall her last headache free day.  She has not identified any headache triggers.  She has associated nausea, vomiting, and photophobia.  She occasionally has visual shining lights. Her daily headaches are ranked as 7/10 and migraines are usually 10/10.  She continues to be functional and has not missed any days from work-related to her headaches.   She takes topiramate 200mg /d for the past year, which has reduced the intensity, but complaints or left hand tingling over the fingertips.  Recently, she started fioricet about 3 times per week which dulls her pain some.  She has tried amitriptyline, relpax, maxalt, and tramadol in the past. She has not seen a neurologist.  She had MRI brain in her 63s which was normal.    UPDATE 02/10/2018:  Unfortunately, there has been no improvement in her headaches despite adding nortriptyline 20mg  qhs, which is giving her dry mouth. There was no relief with zomig.   She is having daily headaches and takes fioricet about once per week.  She is getting migraines about 4 times per week.  She also complains of insomnia and feels that if she could rest, headaches would be better. For the last 2 months, she also complains blurry vision.  There is no eye pain or double vision.  She has not been evaluated by her eye  doctor.  UPDATE 08/15/2018:  She is here for follow-up visit.  She did not have any benefit with gabapentin and would like to stop the medication.  She continues to have headaches every day.  Migranal can reduce the intensity some and has been more effective than other abortive medications.  She is interested in starting an injection therapy.  She continues to take fioricet several times per week, despite my recommendations against this medication given its high propensity for rebound headaches.    Medications:  Current Outpatient Medications on File Prior to Visit  Medication Sig Dispense Refill  . aspirin EC 81 MG tablet Take 81 mg by mouth daily.    . Butalbital-APAP-Caffeine 50-300-40 MG CAPS     . cyclobenzaprine (FLEXERIL) 10 MG tablet     . Multiple Vitamins-Calcium (ONE-A-DAY WOMENS PO) Take by mouth.    . nortriptyline (PAMELOR) 10 MG capsule Start nortriptyline 10mg  at bedtime for 2 week, then increase to 2 tablet at bedtime 60 capsule 5  . omeprazole (PRILOSEC) 40 MG capsule Take 40 mg by mouth daily.    Marland Kitchen topiramate (TOPAMAX) 100 MG tablet     . TOPROL XL 50 MG 24 hr tablet      No current facility-administered medications on file prior to visit.     Allergies:  Allergies  Allergen Reactions  . Amitriptyline   . Codeine   . Latex   . Neosporin [Neomycin-Bacitracin Zn-Polymyx]     Review of Systems:  CONSTITUTIONAL: No fevers,  chills, night sweats, or weight loss.  EYES: +visual changes or eye pain ENT: No hearing changes.  No history of nose bleeds.   RESPIRATORY: No cough, wheezing and shortness of breath.   CARDIOVASCULAR: Negative for chest pain, and palpitations.   GI: Negative for abdominal discomfort, blood in stools or black stools.  No recent change in bowel habits.   GU:  No history of incontinence.   MUSCLOSKELETAL: No history of joint pain or swelling.  No myalgias.   SKIN: Negative for lesions, rash, and itching.   ENDOCRINE: Negative for cold or heat  intolerance, polydipsia or goiter.   PSYCH:  + depression or anxiety symptoms.   NEURO: As Above.   Vital Signs:  BP 120/78   Pulse 77   Ht 5\' 2"  (1.575 m)   Wt 157 lb 2 oz (71.3 kg)   SpO2 99%   BMI 28.74 kg/m    General Medical Exam:   General:  Well appearing, comfortable  Eyes/ENT: see cranial nerve examination.   Neck: No masses appreciated.  Full range of motion without tenderness.  No carotid bruits. Respiratory:  Clear to auscultation, good air entry bilaterally.   Cardiac:  Regular rate and rhythm, no murmur.   Ext:  No edema  Neurological Exam: MENTAL STATUS including orientation to time, place, person, recent and remote memory, attention span and concentration, language, and fund of knowledge is normal.  Speech is not dysarthric.  CRANIAL NERVES:  Pupils equal round and reactive to light.  Normal conjugate, extra-ocular eye movements in all directions of gaze.  No ptosis. Face is symmetric. Palate elevates symmetrically.  Tongue is midline.  MOTOR:  Motor strength is 5/5 in all extremities.  No pronator drift.  COORDINATION/GAIT:   Gait narrow based and stable.   Data: Labs 08/02/2017:  Ferritin 93, TSH 1.40, CMP normal Cr 0.98   IMPRESSION/PLAN: 1.  Chronic daily headaches.  Long history of migraines since teens which transitioned in to daily headaches in her 17s and over the past few years, pain has intensified.               - Previously tried:  Amitriptyline, toradol injection, nortriptyline, gabapentin  - Continue topiramate 200mg  at bedtime  - Continue magnesium 400mg /d   - Start Aimovig 70mg  monthly injections - first injection administered today   2.  Episodic migraine with aura              - Previously tried:  Relpax, maxalt, zomig   - Continue migranal nasal spray  Return to clinic in 4 months   Thank you for allowing me to participate in patient's care.  If I can answer any additional questions, I would be pleased to do so.     Sincerely,    Angelice Piech K. Allena Katz, DO

## 2018-08-15 NOTE — Patient Instructions (Signed)
Start monthly Aimovig injections  Prescription has been sent for Migranal nasal spray - limit to twice per week  Try to limit fioricet as this causes rebound headaches  Return to clinic in 4 months

## 2018-09-23 ENCOUNTER — Telehealth: Payer: Self-pay | Admitting: Neurology

## 2018-09-23 NOTE — Telephone Encounter (Signed)
Patient is calling about prior auth on aimovig injection. Please call her back at [phone]. Thanks!

## 2018-09-24 NOTE — Telephone Encounter (Signed)
I am working on this PA and will call patient.

## 2018-09-26 NOTE — Telephone Encounter (Signed)
Left message for patient that I am working on Linda Beltran.

## 2018-10-01 ENCOUNTER — Other Ambulatory Visit: Payer: Self-pay | Admitting: *Deleted

## 2018-10-01 MED ORDER — ERENUMAB-AOOE 70 MG/ML ~~LOC~~ SOAJ: 70.0000 mg | SUBCUTANEOUS | 11 refills | Status: DC

## 2018-10-01 NOTE — Telephone Encounter (Signed)
Received the following message from [email or web address]  Eligibility could not be verified for this patient - patient not found  Also, insurance card indicates that there is a Med/Rx deductible.  If that has not been met, insurance will not help with cost.

## 2018-11-03 ENCOUNTER — Other Ambulatory Visit: Payer: Self-pay | Admitting: Family Medicine

## 2018-11-03 DIAGNOSIS — Z1231 Encounter for screening mammogram for malignant neoplasm of breast: Secondary | ICD-10-CM

## 2018-12-04 ENCOUNTER — Inpatient Hospital Stay: Admission: RE | Admit: 2018-12-04 | Payer: Managed Care, Other (non HMO) | Source: Ambulatory Visit

## 2018-12-18 NOTE — Progress Notes (Deleted)
Follow-up Visit   Date: 12/18/18    Linda Beltran MRN: [id number] DOB: 02-Jul-1961   Interim History: Linda Beltran is a 58 y.o. right-handed African American female hypertension and hyperlipidemia returning to the clinic for follow-up of migraines.  The patient was accompanied to the clinic by self.   History of present illness: She has long history migraines, which started in her teens.  Headaches would occur about once per month and treated with tylenol.  In her 30s, headache frequency and intensity started getting worse.  Now, she has throbbing/piercing pain over her entire head.  Headaches occur daily, lasting 24 hours.  She does not recall her last headache free day.  She has not identified any headache triggers.  She has associated nausea, vomiting, and photophobia.  She occasionally has visual shining lights. Her daily headaches are ranked as 7/10 and migraines are usually 10/10.  She continues to be functional and has not missed any days from work-related to her headaches.   She takes topiramate 200mg /d for the past year, which has reduced the intensity, but complaints or left hand tingling over the fingertips.  Recently, she started fioricet about 3 times per week which dulls her pain some.  She has tried amitriptyline, relpax, maxalt, and tramadol in the past. She has not seen a neurologist.  She had MRI brain in her 69s which was normal.    UPDATE 02/10/2018:  Unfortunately, there has been no improvement in her headaches despite adding nortriptyline 20mg  qhs, which is giving her dry mouth. There was no relief with zomig.   She is having daily headaches and takes fioricet about once per week.  She is getting migraines about 4 times per week.  She also complains of insomnia and feels that if she could rest, headaches would be better. For the last 2 months, she also complains blurry vision.  There is no eye pain or double vision.  She has not been evaluated by her eye  doctor.  UPDATE 08/15/2018:  She is here for follow-up visit.  She did not have any benefit with gabapentin and would like to stop the medication.  She continues to have headaches every day.  Migranal can reduce the intensity some and has been more effective than other abortive medications.  She is interested in starting an injection therapy.  She continues to take fioricet several times per week, despite my recommendations against this medication given its high propensity for rebound headaches.   UPDATE 12/19/2018: She is here for follow-up visit today.  At her last visit, she was started on Aimovig 70mg  monthly injections. ***  Medications:  Current Outpatient Medications on File Prior to Visit  Medication Sig Dispense Refill  . aspirin EC 81 MG tablet Take 81 mg by mouth daily.    . Butalbital-APAP-Caffeine 50-300-40 MG CAPS     . cyclobenzaprine (FLEXERIL) 10 MG tablet     . dihydroergotamine (MIGRANAL) 4 MG/ML nasal spray Place 1 spray into the nose as needed for migraine. Use in one nostril as directed.  No more than 4 sprays in one hour 8 mL 12  . Erenumab-aooe (AIMOVIG) 70 MG/ML SOAJ Inject 70 mg into the skin every 30 (thirty) days. 1 pen 11  . Multiple Vitamins-Calcium (ONE-A-DAY WOMENS PO) Take by mouth.    Marland Kitchen omeprazole (PRILOSEC) 40 MG capsule Take 40 mg by mouth daily.    Marland Kitchen topiramate (TOPAMAX) 100 MG tablet     . TOPROL XL 50 MG 24  hr tablet      No current facility-administered medications on file prior to visit.     Allergies:  Allergies  Allergen Reactions  . Amitriptyline   . Codeine   . Latex   . Neosporin [Neomycin-Bacitracin Zn-Polymyx]     Review of Systems:  CONSTITUTIONAL: No fevers, chills, night sweats, or weight loss.  EYES: +visual changes or eye pain ENT: No hearing changes.  No history of nose bleeds.   RESPIRATORY: No cough, wheezing and shortness of breath.   CARDIOVASCULAR: Negative for chest pain, and palpitations.   GI: Negative for abdominal  discomfort, blood in stools or black stools.  No recent change in bowel habits.   GU:  No history of incontinence.   MUSCLOSKELETAL: No history of joint pain or swelling.  No myalgias.   SKIN: Negative for lesions, rash, and itching.   ENDOCRINE: Negative for cold or heat intolerance, polydipsia or goiter.   PSYCH:  + depression or anxiety symptoms.   NEURO: As Above.   Vital Signs:  There were no vitals taken for this visit.   General Medical Exam:   General:  Well appearing, comfortable  Eyes/ENT: see cranial nerve examination.   Neck: No masses appreciated.  Full range of motion without tenderness.  No carotid bruits. Respiratory:  Clear to auscultation, good air entry bilaterally.   Cardiac:  Regular rate and rhythm, no murmur.   Ext:  No edema  Neurological Exam: MENTAL STATUS including orientation to time, place, person, recent and remote memory, attention span and concentration, language, and fund of knowledge is normal.  Speech is not dysarthric.  CRANIAL NERVES:  Pupils equal round and reactive to light.  Normal conjugate, extra-ocular eye movements in all directions of gaze.  No ptosis. Face is symmetric. Palate elevates symmetrically.  Tongue is midline.  MOTOR:  Motor strength is 5/5 in all extremities.  No pronator drift.  COORDINATION/GAIT:   Gait narrow based and stable.   Data: Labs 08/02/2017:  Ferritin 93, TSH 1.40, CMP normal Cr 0.98   IMPRESSION/PLAN: 1.  Chronic daily headaches.  Long history of migraines since teens which transitioned in to daily headaches in her 68s and over the past few years, pain has intensified.               - Previously tried:  Amitriptyline, toradol injection, nortriptyline, gabapentin  - Continue topiramate 200mg  at bedtime  - Continue magnesium 400mg /d   - Start Aimovig 70mg  monthly injections - first injection administered today   2.  Episodic migraine with aura              - Previously tried:  Relpax, maxalt, zomig   -  Continue migranal nasal spray  ***   Thank you for allowing me to participate in patient's care.  If I can answer any additional questions, I would be pleased to do so.    Sincerely,    Donika K. Allena Katz, DO

## 2018-12-19 ENCOUNTER — Ambulatory Visit: Payer: Managed Care, Other (non HMO) | Admitting: Neurology

## 2019-03-16 DIAGNOSIS — I10 Essential (primary) hypertension: Secondary | ICD-10-CM | POA: Diagnosis not present

## 2019-03-16 DIAGNOSIS — E785 Hyperlipidemia, unspecified: Secondary | ICD-10-CM | POA: Diagnosis not present

## 2019-03-16 DIAGNOSIS — E119 Type 2 diabetes mellitus without complications: Secondary | ICD-10-CM | POA: Diagnosis not present

## 2019-03-16 DIAGNOSIS — G43109 Migraine with aura, not intractable, without status migrainosus: Secondary | ICD-10-CM | POA: Diagnosis not present

## 2019-03-27 DIAGNOSIS — I10 Essential (primary) hypertension: Secondary | ICD-10-CM | POA: Diagnosis not present

## 2019-03-27 DIAGNOSIS — E785 Hyperlipidemia, unspecified: Secondary | ICD-10-CM | POA: Diagnosis not present

## 2019-03-27 DIAGNOSIS — E119 Type 2 diabetes mellitus without complications: Secondary | ICD-10-CM | POA: Diagnosis not present

## 2019-07-28 DIAGNOSIS — Z23 Encounter for immunization: Secondary | ICD-10-CM | POA: Diagnosis not present

## 2019-09-02 ENCOUNTER — Encounter: Payer: Self-pay | Admitting: *Deleted

## 2019-09-02 NOTE — Progress Notes (Signed)
Linda Beltran (Key: [id number]) Aimovig 70MG /ML auto-injectors   Form [employer] Electronic PA Form Created 30 minutes ago Sent to Plan 14 minutes ago Plan Response 14 minutes ago Submit Clinical Questions 7 minutes ago Determination Favorable 7 minutes ago Message from Plan CaseId:[id number];Status:Approved;Review Type:Prior Auth;Coverage Start Date:08/03/2019;Coverage End Date:09/01/2020;

## 2019-10-20 DIAGNOSIS — Z0000 Encounter for general adult medical examination without abnormal findings: Secondary | ICD-10-CM | POA: Diagnosis not present

## 2019-11-05 DIAGNOSIS — E119 Type 2 diabetes mellitus without complications: Secondary | ICD-10-CM | POA: Diagnosis not present

## 2019-11-05 DIAGNOSIS — E785 Hyperlipidemia, unspecified: Secondary | ICD-10-CM | POA: Diagnosis not present

## 2019-11-05 DIAGNOSIS — I10 Essential (primary) hypertension: Secondary | ICD-10-CM | POA: Diagnosis not present

## 2020-03-01 ENCOUNTER — Telehealth: Payer: Self-pay | Admitting: Neurology

## 2020-03-01 NOTE — Telephone Encounter (Signed)
Patient advised.

## 2020-03-01 NOTE — Telephone Encounter (Signed)
Please advise providers?

## 2020-03-01 NOTE — Telephone Encounter (Signed)
Patient called to schedule an appointment with Dr. Allena Katz. I informed her that Dr. Allena Katz is out until August. She is interested in seeing a different provider, she has headaches/migraines. Which provider would best fit her needs? Please advise.

## 2020-03-01 NOTE — Telephone Encounter (Signed)
While we are happy to cross cover for emergency needs, we are not going to be changing providers for Dr. Jill Alexanders patients.

## 2020-03-01 NOTE — Telephone Encounter (Signed)
No answer at 1001

## 2020-03-09 DIAGNOSIS — G43919 Migraine, unspecified, intractable, without status migrainosus: Secondary | ICD-10-CM | POA: Diagnosis not present

## 2020-07-28 DIAGNOSIS — Z23 Encounter for immunization: Secondary | ICD-10-CM | POA: Diagnosis not present

## 2020-08-03 ENCOUNTER — Encounter: Payer: Self-pay | Admitting: Neurology

## 2020-08-03 NOTE — Progress Notes (Signed)
Olam Idler (Key: [id number]) Aimovig 70MG /ML auto-injectors   Form [employer] Electronic PA Form [phone] NCP[place]) Created 1 hour ago Sent to Plan 12 minutes ago Plan Response 11 minutes ago Submit Clinical Questions 9 minutes ago Determination Favorable 9 minutes ago Message from Plan CaseId:[id number];Status:Approved;Review Type:Prior Auth;Coverage Start Date:07/04/2020;Coverage End Date:08/03/2021;

## 2021-03-31 DIAGNOSIS — E1165 Type 2 diabetes mellitus with hyperglycemia: Secondary | ICD-10-CM | POA: Diagnosis not present

## 2021-03-31 DIAGNOSIS — E785 Hyperlipidemia, unspecified: Secondary | ICD-10-CM | POA: Diagnosis not present

## 2021-03-31 DIAGNOSIS — E119 Type 2 diabetes mellitus without complications: Secondary | ICD-10-CM | POA: Diagnosis not present

## 2021-03-31 DIAGNOSIS — I10 Essential (primary) hypertension: Secondary | ICD-10-CM | POA: Diagnosis not present

## 2021-07-04 DIAGNOSIS — E119 Type 2 diabetes mellitus without complications: Secondary | ICD-10-CM | POA: Diagnosis not present

## 2021-07-14 ENCOUNTER — Telehealth: Payer: Self-pay

## 2021-07-14 NOTE — Telephone Encounter (Signed)
New message   Prior authorization was initiated for Aimovig 70 mg.  The website covers my meds, the patient is inactive.   The patient was contacted for clarification on insurance patient verbalized her current insurance is [employer], the patient is aware verbalized understanding to come by the office to allow [place] to scan the new insurance card into the system.  Message from Plan Patient Inactive   Linda Beltran (Key: Allen Kell) Aimovig 70MG /ML auto-injectors   Form [employer] Electronic PA Form (2017 NCPDP) Created 9 days ago Sent to Plan less than a minute ago

## 2021-10-20 DIAGNOSIS — R202 Paresthesia of skin: Secondary | ICD-10-CM | POA: Diagnosis not present

## 2021-10-27 DIAGNOSIS — I10 Essential (primary) hypertension: Secondary | ICD-10-CM | POA: Diagnosis not present

## 2021-10-27 DIAGNOSIS — E1165 Type 2 diabetes mellitus with hyperglycemia: Secondary | ICD-10-CM | POA: Diagnosis not present

## 2021-10-27 DIAGNOSIS — R202 Paresthesia of skin: Secondary | ICD-10-CM | POA: Diagnosis not present

## 2021-11-16 ENCOUNTER — Other Ambulatory Visit: Payer: Self-pay | Admitting: Family Medicine

## 2021-12-01 ENCOUNTER — Other Ambulatory Visit: Payer: Self-pay | Admitting: Family Medicine

## 2021-12-01 DIAGNOSIS — R202 Paresthesia of skin: Secondary | ICD-10-CM

## 2021-12-23 ENCOUNTER — Other Ambulatory Visit: Payer: Self-pay

## 2021-12-23 ENCOUNTER — Ambulatory Visit
Admission: RE | Admit: 2021-12-23 | Discharge: 2021-12-23 | Disposition: A | Discharge status: Home / Self Care | Payer: BC Managed Care – PPO | Source: Ambulatory Visit | Attending: Family Medicine | Admitting: Family Medicine

## 2021-12-23 DIAGNOSIS — M545 Low back pain, unspecified: Secondary | ICD-10-CM | POA: Diagnosis not present

## 2021-12-23 DIAGNOSIS — M48061 Spinal stenosis, lumbar region without neurogenic claudication: Secondary | ICD-10-CM | POA: Diagnosis not present

## 2021-12-23 DIAGNOSIS — R202 Paresthesia of skin: Secondary | ICD-10-CM

## 2021-12-23 IMAGING — MR MR LUMBAR SPINE W/O CM
4 of 5 series · 27 of 48 positions shown · non-contrast
Comparison: None.

CLINICAL DATA: Low back pain with bilateral lower extremity pain
over the last 2 months.

EXAM:
MRI LUMBAR SPINE WITHOUT CONTRAST
TECHNIQUE: Multiplanar, multisequence MR imaging of the lumbar spine was
performed. No intravenous contrast was administered.

[Series 3: T2 · sagittal · 4.0mm · 1.09mm/px · 6 of 17 slices shown (1 of 2)]
[im 1/17]
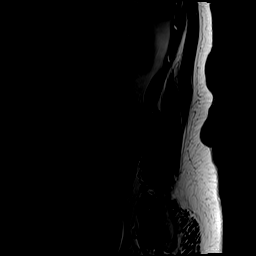
[im 4/17]
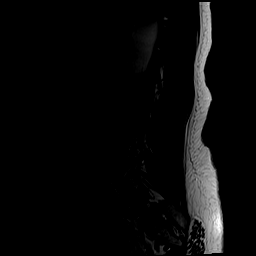
[im 7/17]
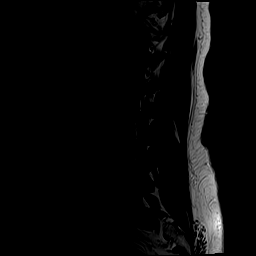
[im 10/17]
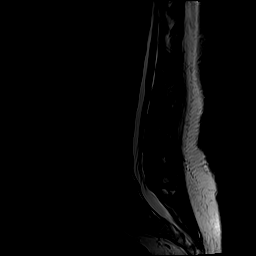
[im 13/17]
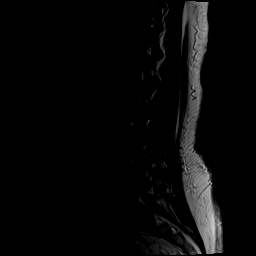
[im 17/17]
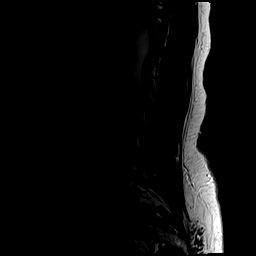

[Series 5: T1 · sagittal · 4.0mm · 1.09mm/px · 6 of 17 slices shown (1 of 2)]
[im 1/17]
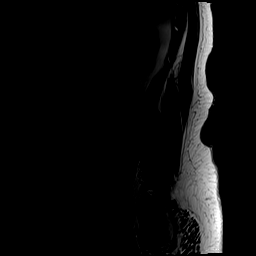
[im 4/17]
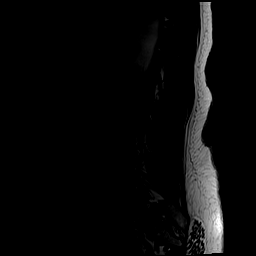
[im 7/17]
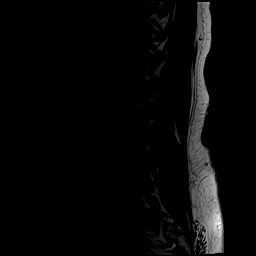
[im 10/17]
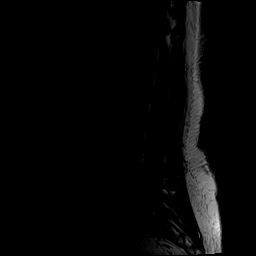
[im 13/17]
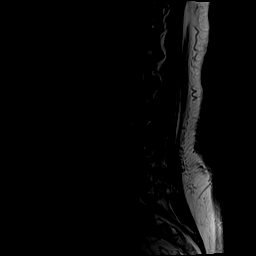
[im 17/17]
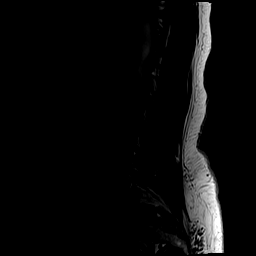

[Series 6: T2 · axial · 4.0mm · 0.39mm/px · z∈[-86,+121]mm · 9 of 41 slices shown (2 of 2)]
[im 1/41]
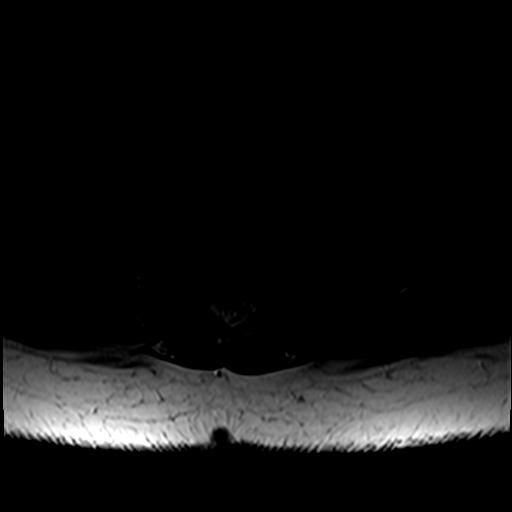
[im 6/41]
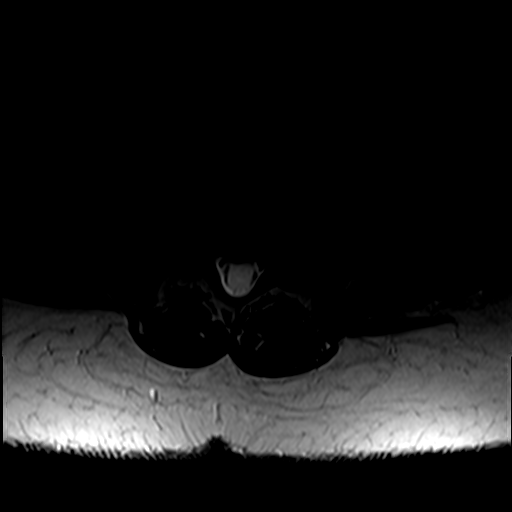
[im 12/41]
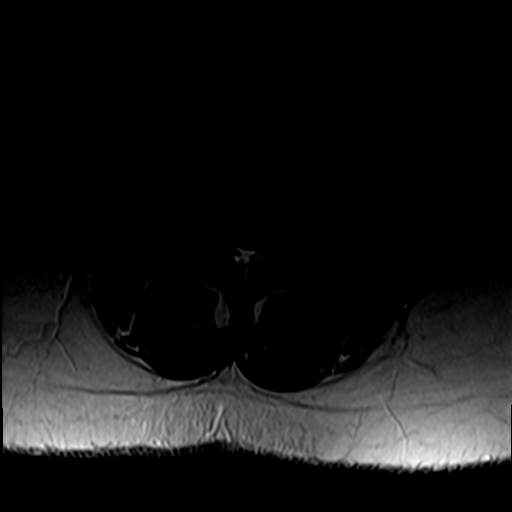
[im 18/41]
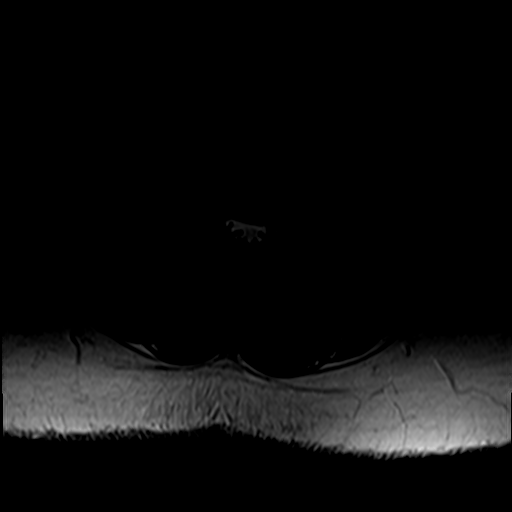
[im 21/41]
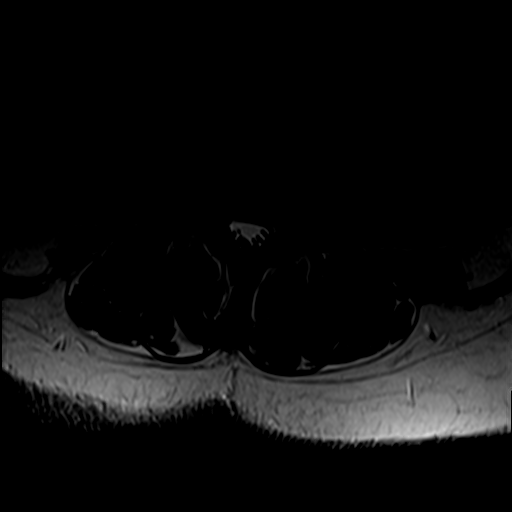
[im 23/41]
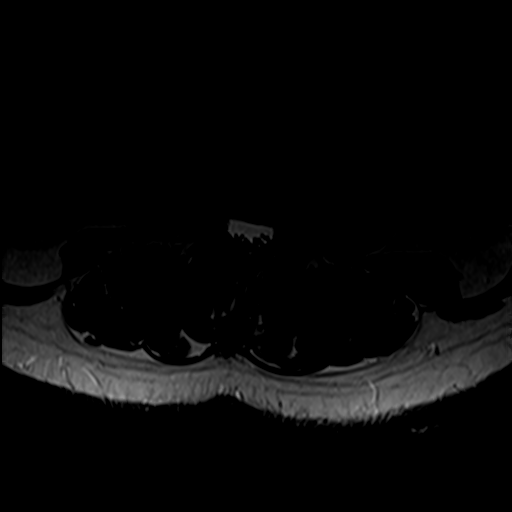
[im 29/41]
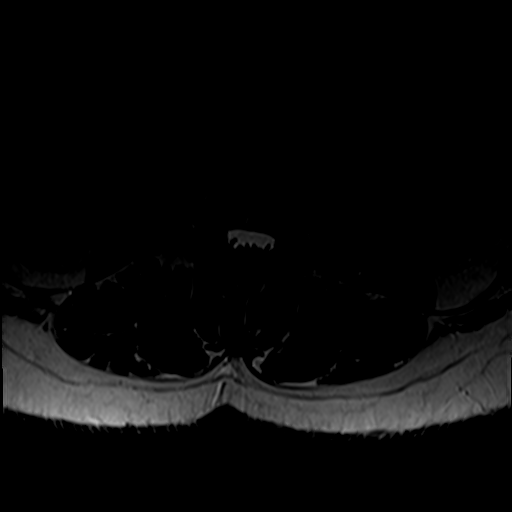
[im 35/41]
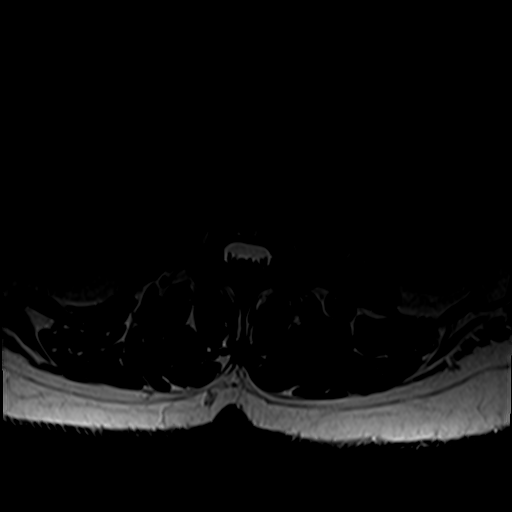
[im 41/41]
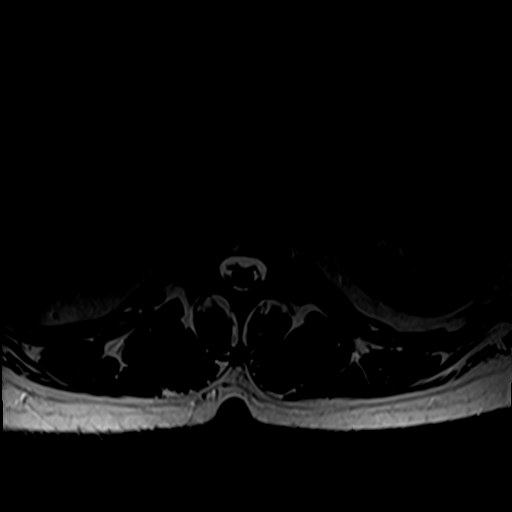

[Series 7: T1 · axial · 4.0mm · 0.39mm/px · z∈[-86,+92]mm · 6 of 41 slices shown (2 of 2)]
[im 1/41]
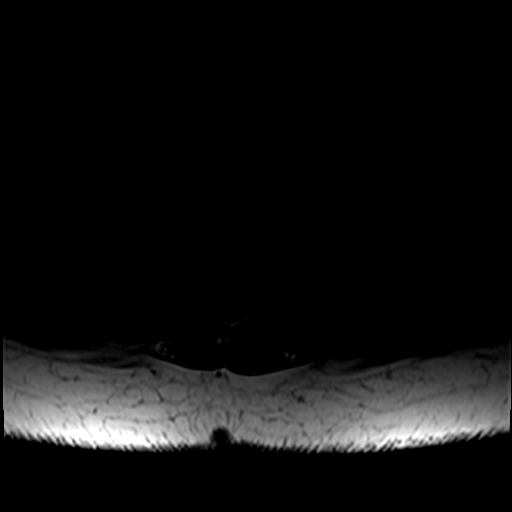
[im 6/41]
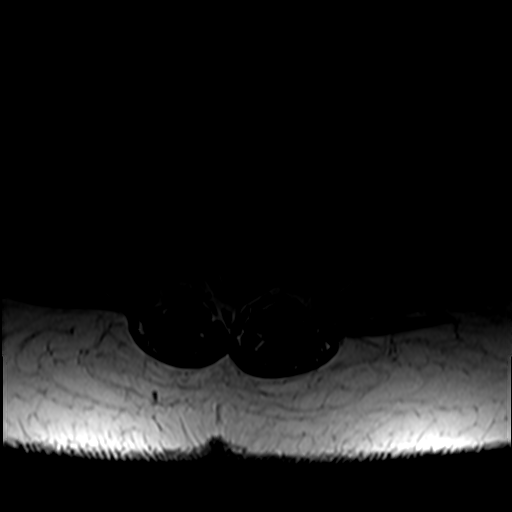
[im 12/41]
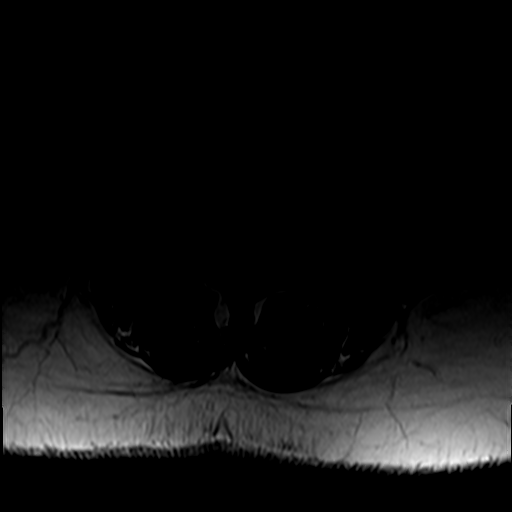
[im 18/41]
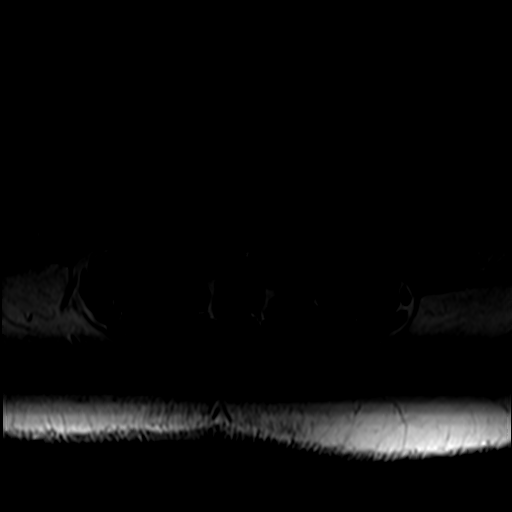
[im 21/41]
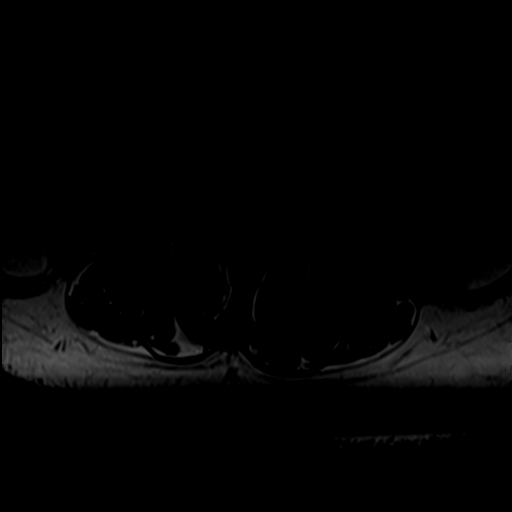
[im 35/41]
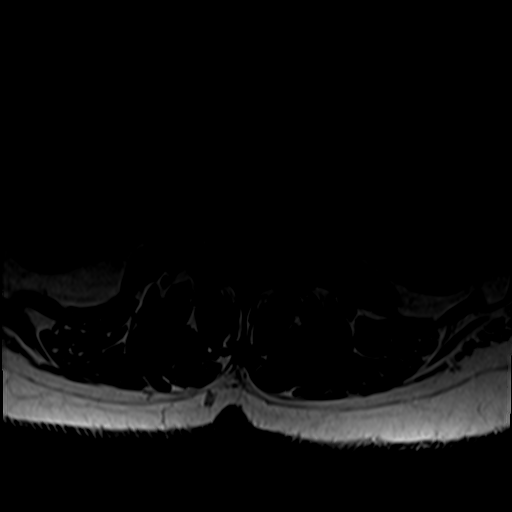

[27 of 48 positions shown; findings below may reference images not displayed]

FINDINGS: Segmentation: There is transitional now RENAE1 is described as a
transitional vertebra. Correlation with this numbering scheme would
be important should intervention be contemplated.

Alignment:  Normal

Vertebrae:  No focal bone finding.

Conus medullaris and cauda equina: Conus extends to the L1-2 level.
Conus and cauda equina appear normal.

Paraspinal and other soft tissues: Normal

Disc levels:

No abnormality at L3-4 or above.

L4-5: Mild bulging of the disc. No compressive canal or foraminal
stenosis.

L5-S1: Desiccation of the disc with a shallow disc protrusion more
prominent towards the left. Facet and ligamentous hypertrophy.
Stenosis of the lateral recesses, left more than right. Neural
compression could occur on either side at this level, more likely
the left.

S1-2: Transitional level. Normal appearance of the disc. No canal or
foraminal stenosis.
IMPRESSION: S1 is described as a transitional vertebra. Correlation with this
numbering terminology would be important should intervention be
contemplated.

L5-S1: Disc desiccation with a shallow disc protrusion more
prominent towards the left. Facet and ligamentous hypertrophy.
Stenosis of the lateral recesses, left more than right. Neural
compression could occur at this level, more likely on the left.

L4-5: Mild noncompressive disc bulge.

## 2022-01-03 DIAGNOSIS — R200 Anesthesia of skin: Secondary | ICD-10-CM | POA: Diagnosis not present

## 2022-01-03 DIAGNOSIS — R2689 Other abnormalities of gait and mobility: Secondary | ICD-10-CM | POA: Diagnosis not present

## 2022-01-03 DIAGNOSIS — Z6831 Body mass index (BMI) 31.0-31.9, adult: Secondary | ICD-10-CM | POA: Diagnosis not present

## 2022-01-03 DIAGNOSIS — I10 Essential (primary) hypertension: Secondary | ICD-10-CM | POA: Diagnosis not present

## 2022-01-10 ENCOUNTER — Other Ambulatory Visit: Payer: Self-pay | Admitting: Neurological Surgery

## 2022-01-10 DIAGNOSIS — R200 Anesthesia of skin: Secondary | ICD-10-CM

## 2022-01-27 ENCOUNTER — Ambulatory Visit
Admission: RE | Admit: 2022-01-27 | Discharge: 2022-01-27 | Disposition: A | Discharge status: Home / Self Care | Payer: BC Managed Care – PPO | Source: Ambulatory Visit | Attending: Neurological Surgery | Admitting: Neurological Surgery

## 2022-01-27 ENCOUNTER — Other Ambulatory Visit: Payer: Self-pay

## 2022-01-27 DIAGNOSIS — R29898 Other symptoms and signs involving the musculoskeletal system: Secondary | ICD-10-CM | POA: Diagnosis not present

## 2022-01-27 DIAGNOSIS — M549 Dorsalgia, unspecified: Secondary | ICD-10-CM | POA: Diagnosis not present

## 2022-01-27 DIAGNOSIS — R200 Anesthesia of skin: Secondary | ICD-10-CM

## 2022-01-27 DIAGNOSIS — M2578 Osteophyte, vertebrae: Secondary | ICD-10-CM | POA: Diagnosis not present

## 2022-01-27 DIAGNOSIS — M542 Cervicalgia: Secondary | ICD-10-CM | POA: Diagnosis not present

## 2022-01-27 IMAGING — MR MR CERVICAL SPINE W/O CM
5 series · 34 of 48 positions shown · non-contrast
Comparison: None.

CLINICAL DATA: Neck pain and back pain radiating to the feet and
legs. Weakness and numbness.

EXAM:
MRI CERVICAL SPINE WITHOUT CONTRAST
TECHNIQUE: Multiplanar, multisequence MR imaging of the cervical spine was
performed. No intravenous contrast was administered.

[Series 2: T2 · sagittal · 3.0mm · 0.45mm/px · 8 of 16 slices shown (1 of 2)]
[im 1/16]
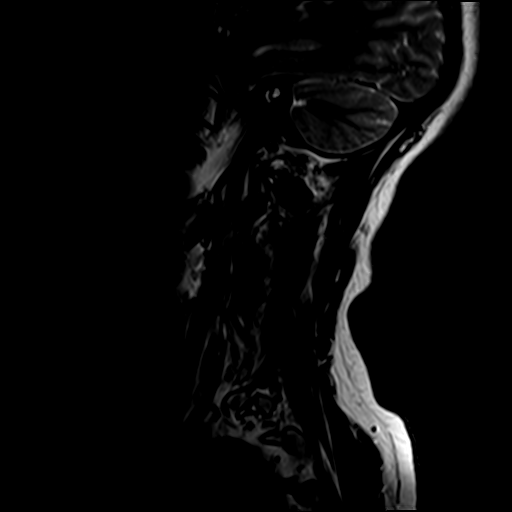
[im 3/16]
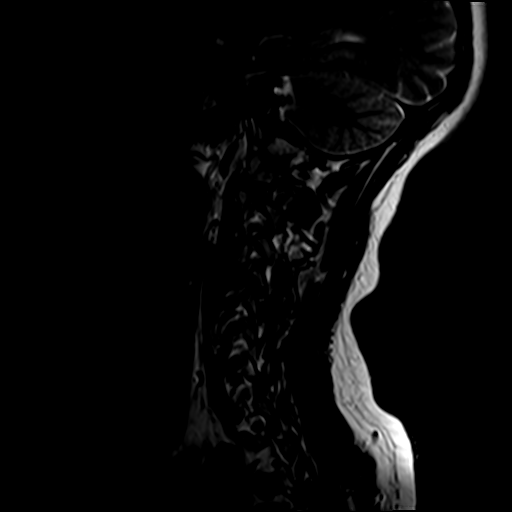
[im 5/16]
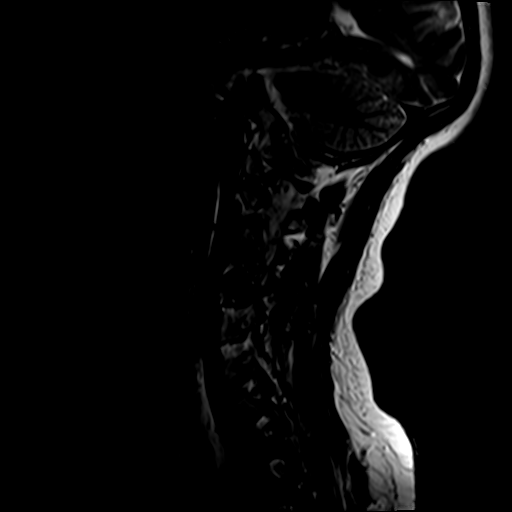
[im 7/16]
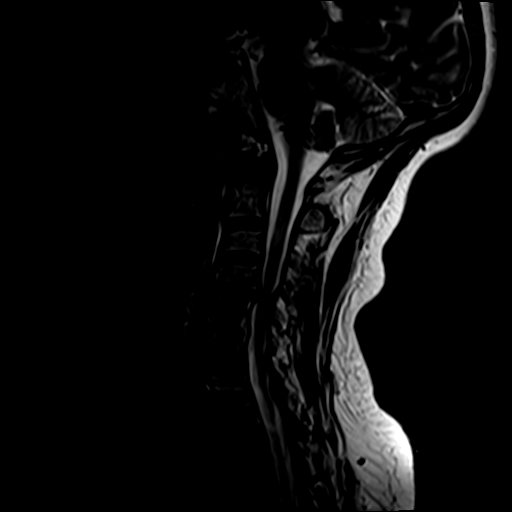
[im 9/16]
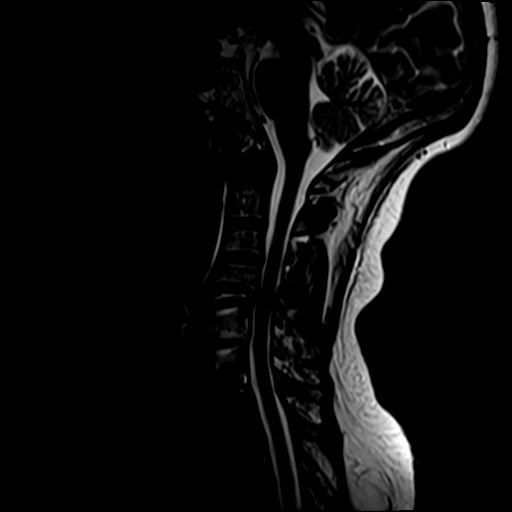
[im 11/16]
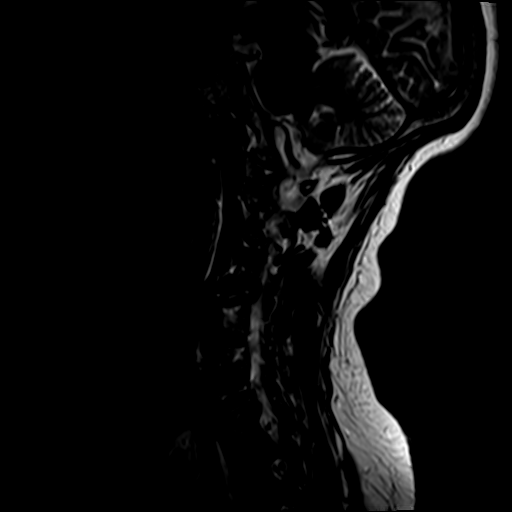
[im 13/16]
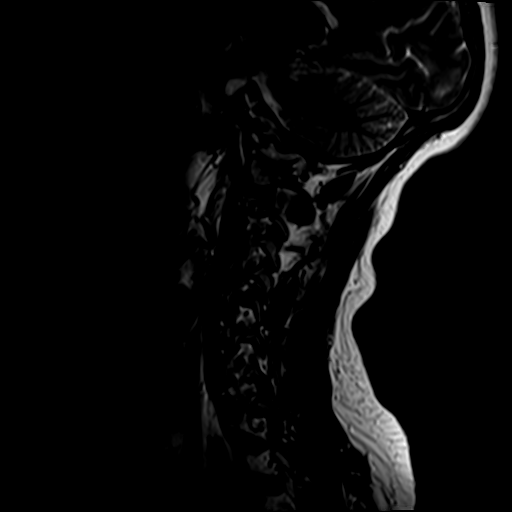
[im 16/16]
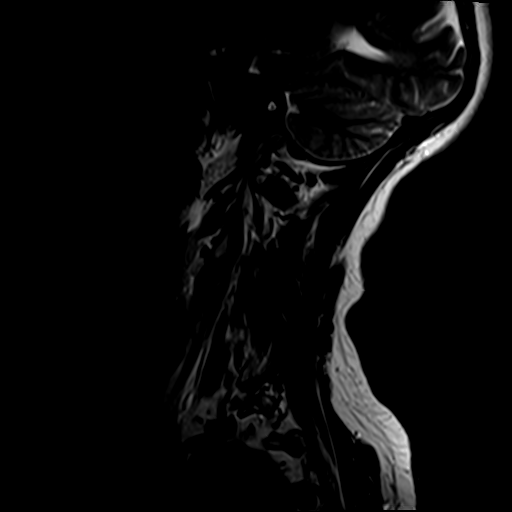

[Series 3: T1 · sagittal · 3.0mm · 0.90mm/px · 7 of 16 slices shown]
[im 1/16]
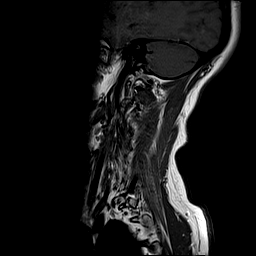
[im 3/16]
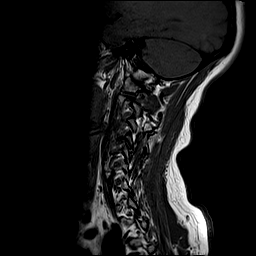
[im 6/16]
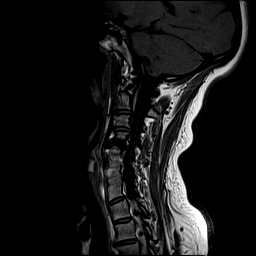
[im 8/16]
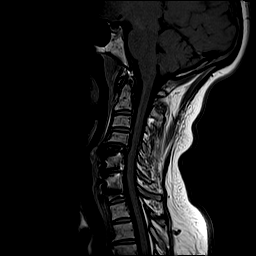
[im 11/16]
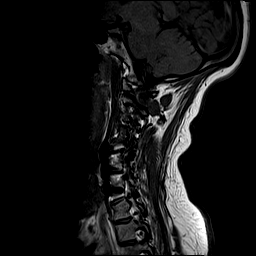
[im 13/16]
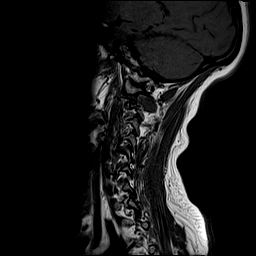
[im 16/16]
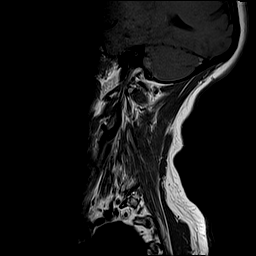

[Series 4: STIR · sagittal · 3.0mm · 0.90mm/px · 7 of 16 slices shown]
[im 1/16]
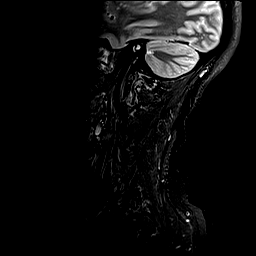
[im 3/16]
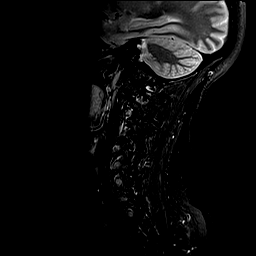
[im 6/16]
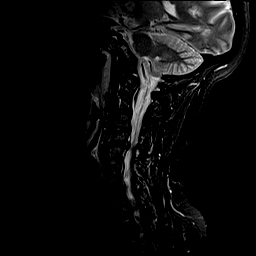
[im 8/16]
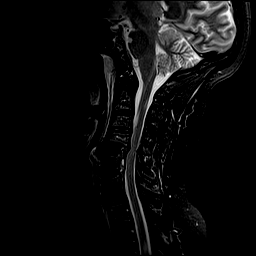
[im 11/16]
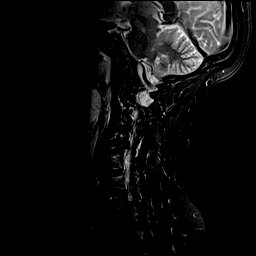
[im 13/16]
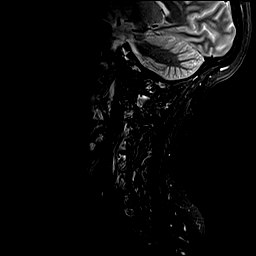
[im 16/16]
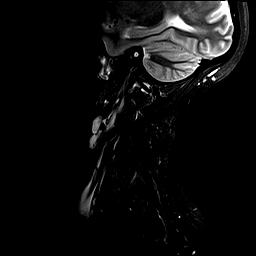

[Series 5: T2 · axial · 3.0mm · 0.70mm/px · z∈[-90,+8]mm · 9 of 28 slices shown (2 of 2)]
[im 1/28]
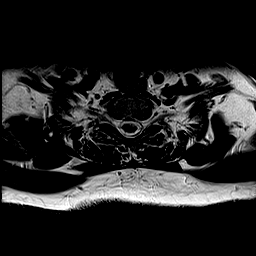
[im 5/28]
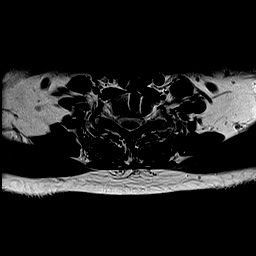
[im 10/28]
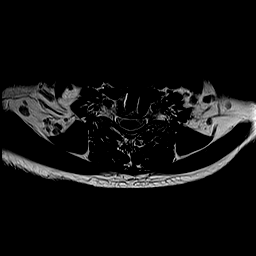
[im 12/28]
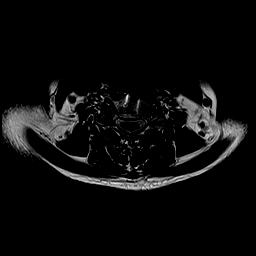
[im 14/28]
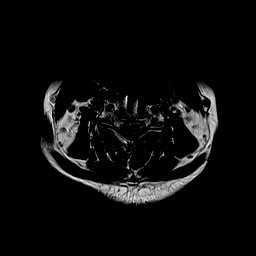
[im 16/28]
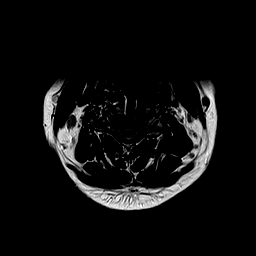
[im 19/28]
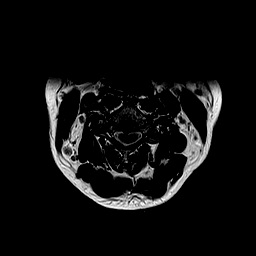
[im 23/28]
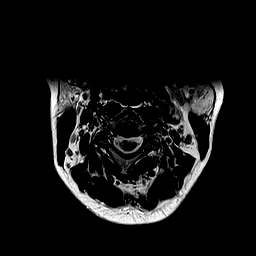
[im 28/28]
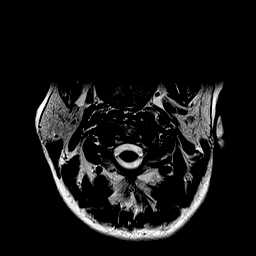

[Series 6: GRE · axial · 3.0mm · 0.35mm/px · z∈[-90,-57]mm · 3 of 28 slices shown]
[im 1/28]
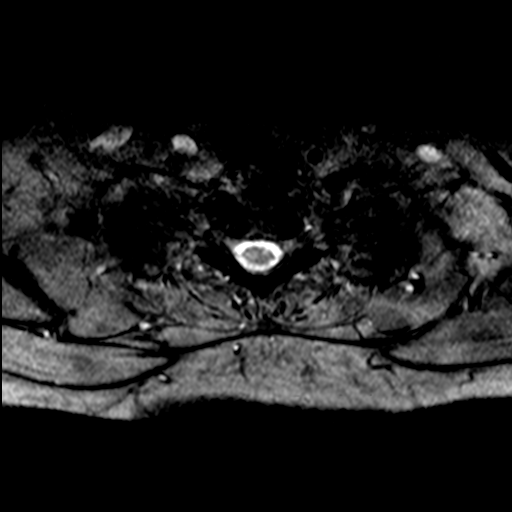
[im 5/28]
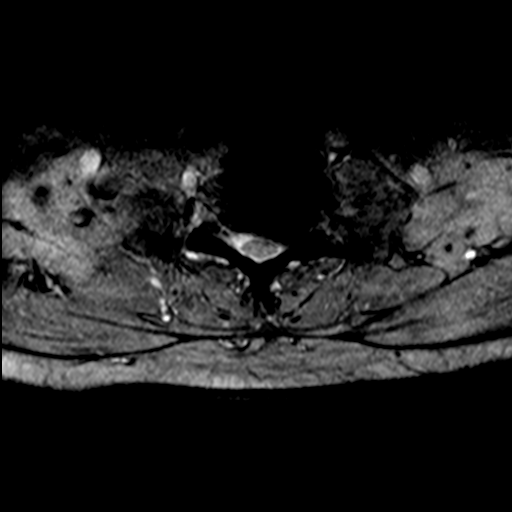
[im 10/28]
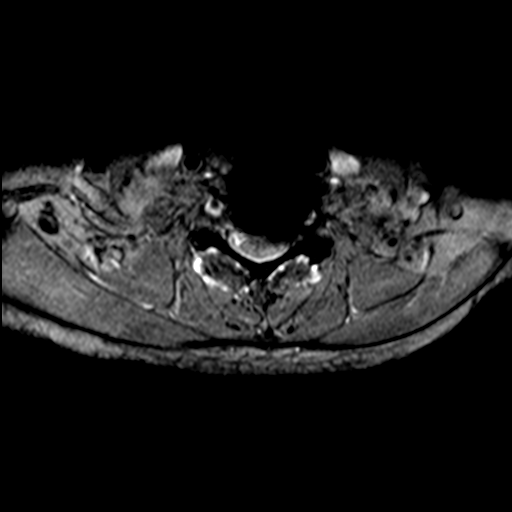

[34 of 48 positions shown; findings below may reference images not displayed]

FINDINGS: Alignment: Straightening of the normal cervical lordosis.

Vertebrae: Distant fusion from C5 through C7.

Cord: See below regarding stenosis at C4-5. See results of thoracic
study regarding abnormal noncompressive cord signal from T2-3
through T3-4. there is abnormal T2 bright signal within the ventral
cord from the level of the C2-3 disc through the C3 level that is
not related to compressive disease. This could be demyelinating
disease.

Posterior Fossa, vertebral arteries, paraspinal tissues: Abnormal
signal in the right ventral pons. Question of this represents an
infarction or a nonischemic T2 bright lesion. Consider brain
evaluation.

Disc levels:

Foramen magnum, C1-2, and C2-3 are unremarkable.

C3-4: Central disc bulge.  No compressive stenosis.

C4-5: Spondylosis with endplate osteophytes and bulging of the disc.
Canal narrowing with effacement of the subarachnoid space and some
cord deformity. AP diameter of the canal 7.3 mm. Bilateral foraminal
stenosis. Question early abnormal T2 signal in the cord at this
level, likely secondary to compressive myelopathy.

C5 through C7: Previous ACDF with wide patency of the canal and
foramina.

C7-T1: Normal interspace.
IMPRESSION: Distant ACDF C5 through C7 has a good appearance.

Adjacent segment degenerative disease at C4-5 with canal stenosis,
AP diameter 7.3 mm. Effacement of the subarachnoid space and some
deformity of the cord, possibly with early abnormal T2 signal
affecting the cord. This could represent early compressive
myelopathy. Bilateral foraminal stenosis at this level could affect
either C5 nerve.

Abnormal T2 signal within the ventral cord at the lower C2 through
C3 region. Question demyelinating disease.

Abnormal T2 signal in the right ventral pons. Question demyelinating
disease versus old ischemic insult. Consider brain imaging.

## 2022-01-27 IMAGING — MR MR THORACIC SPINE W/O CM
4 of 6 series · 21 of 48 positions shown · non-contrast
Comparison: None.

CLINICAL DATA: Neck and low back pain radiating to bilateral
legs/feet with weakness and numbness since, [DATE].

EXAM:
MRI THORACIC SPINE WITHOUT CONTRAST
TECHNIQUE: Multiplanar, multisequence MR imaging of the thoracic spine was
performed. No intravenous contrast was administered.

[Series 1: T1 · sagittal · 3.0mm · 1.06mm/px · 3 of 14 slices shown]
[im 1/14]
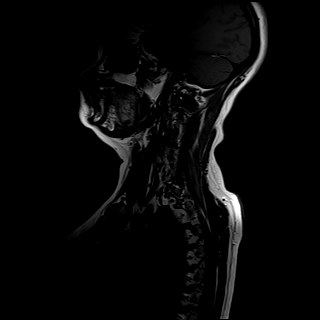
[im 9/14]
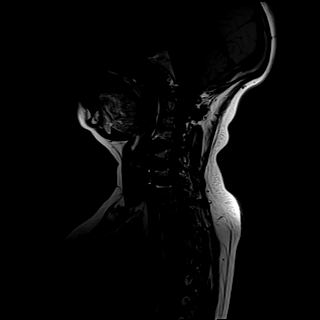
[im 14/14]
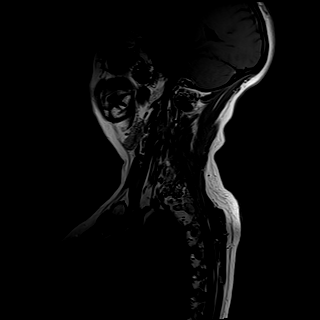

[Series 3: T2 · sagittal · 4.0mm · 0.50mm/px · 6 of 16 slices shown (1 of 3)]
[im 1/16]
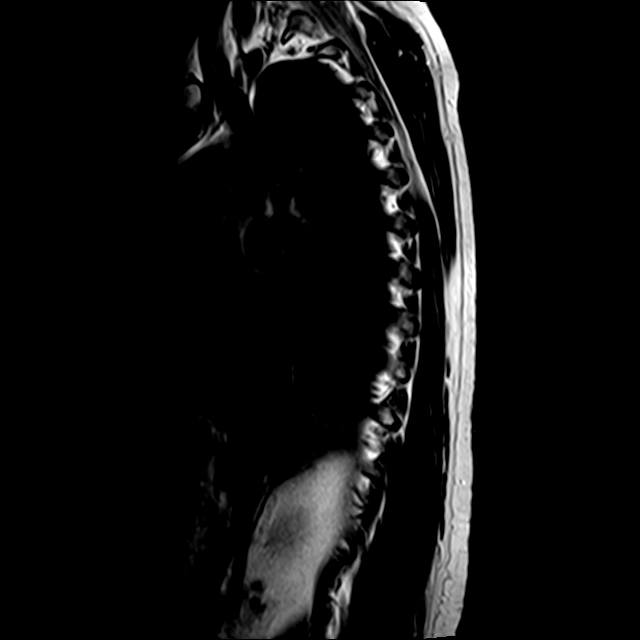
[im 4/16]
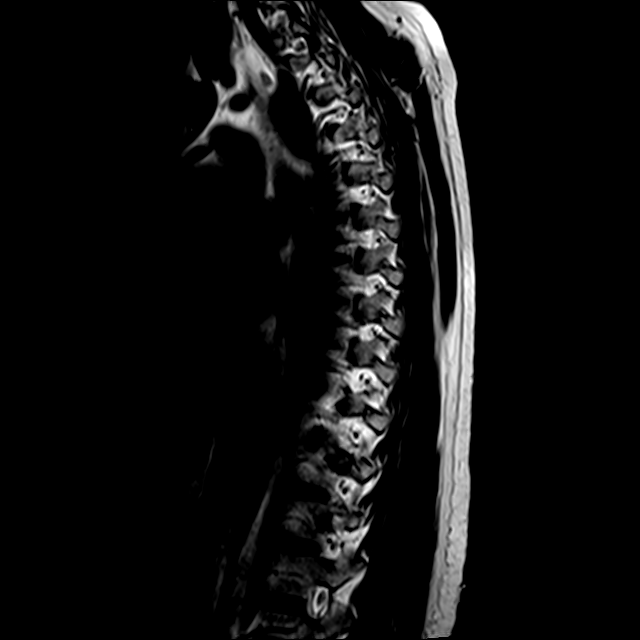
[im 7/16]
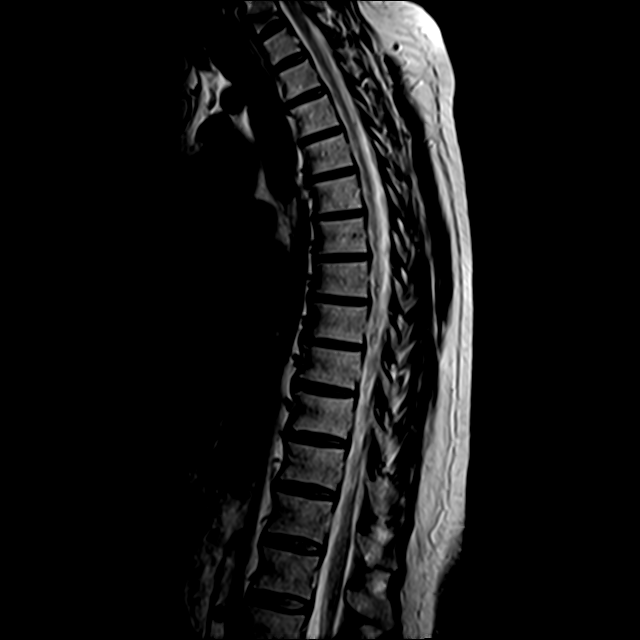
[im 10/16]
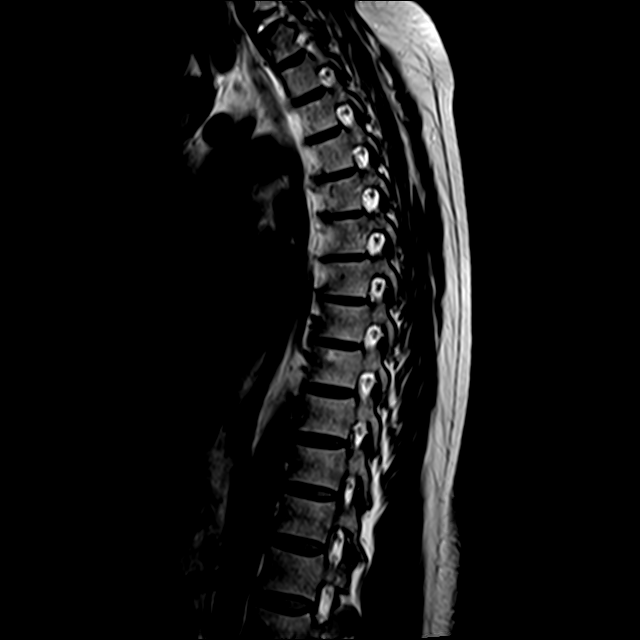
[im 13/16]
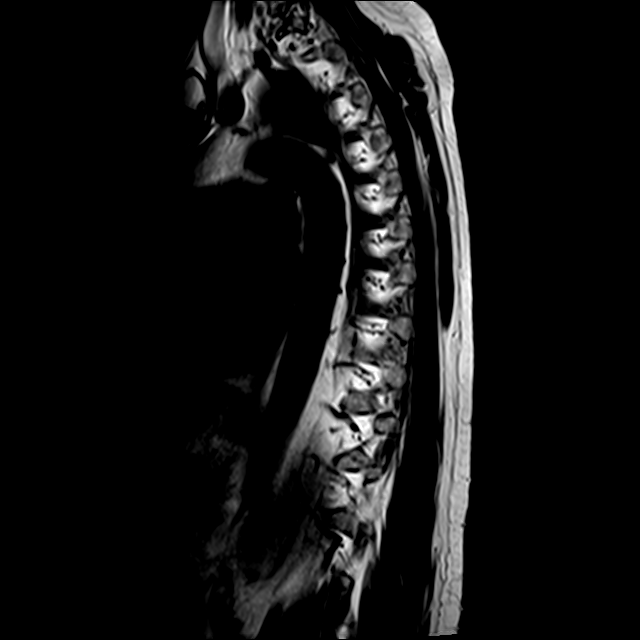
[im 16/16]
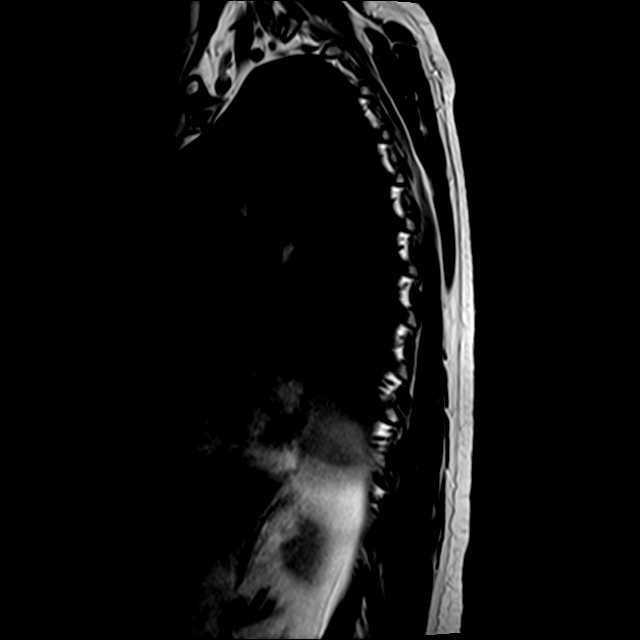

[Series 6: T2 · axial · 4.0mm · 0.39mm/px · z∈[-305,-90]mm · 9 of 36 slices shown (2 of 3)]
[im 1/36]
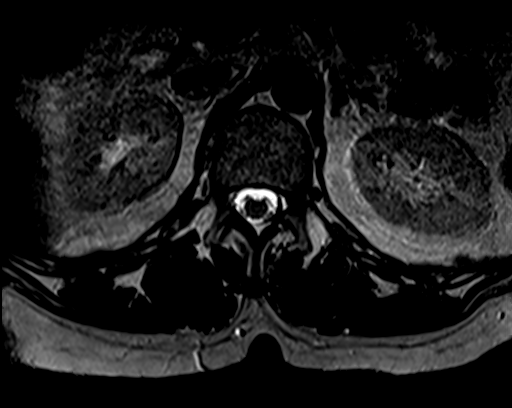
[im 6/36]
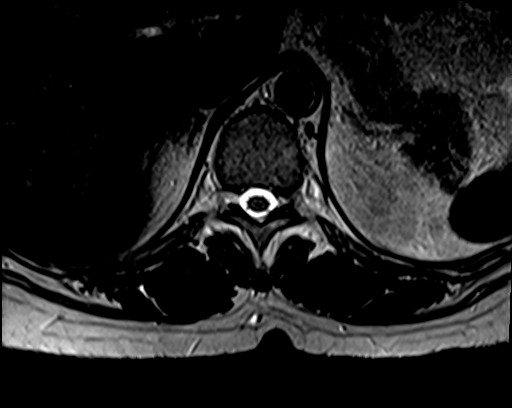
[im 12/36]
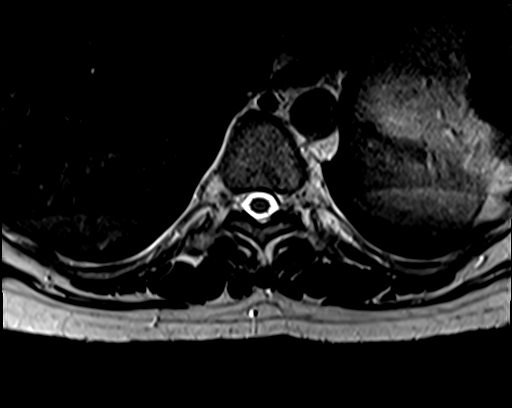
[im 15/36]
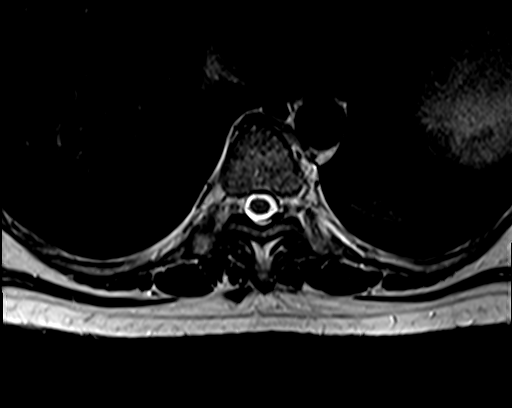
[im 18/36]
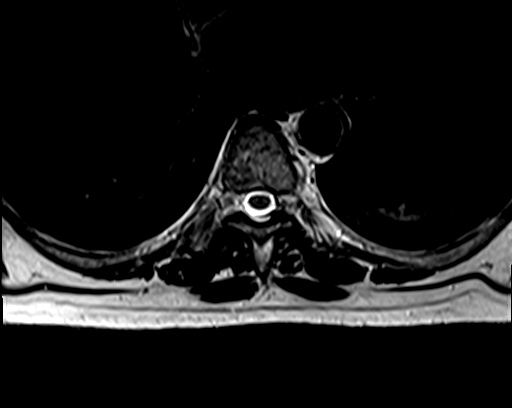
[im 21/36]
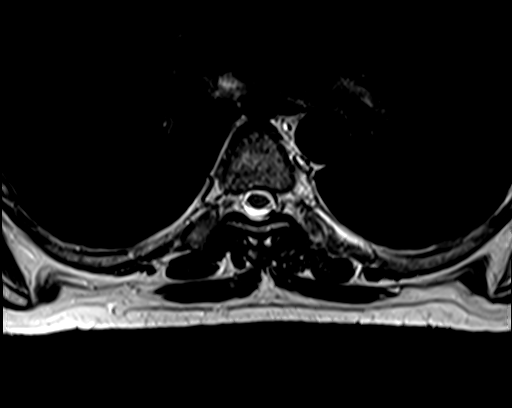
[im 24/36]
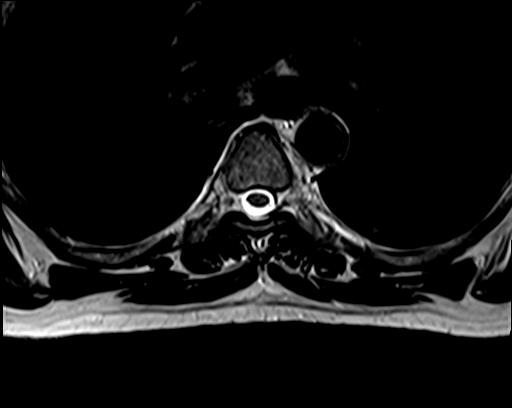
[im 30/36]
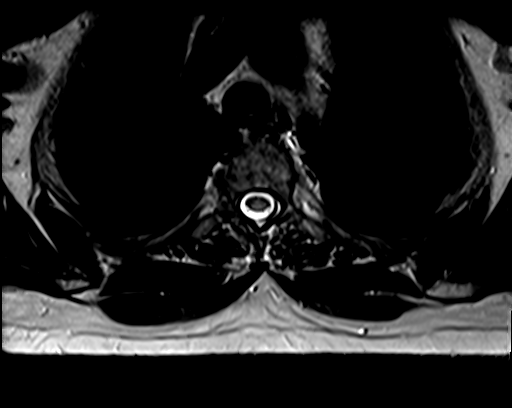
[im 36/36]
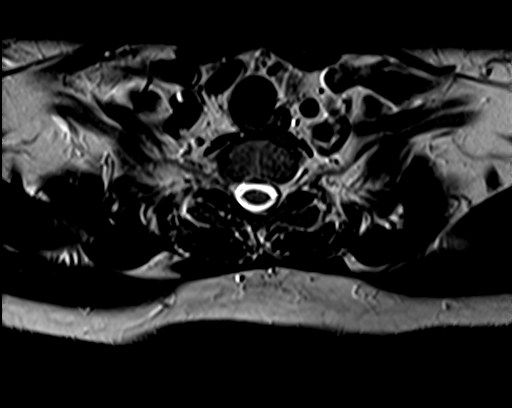

[Series 7: T2 · axial · 4.0mm · 0.39mm/px · z∈[-268,-116]mm · 3 of 36 slices shown (3 of 3)]
[im 6/36]
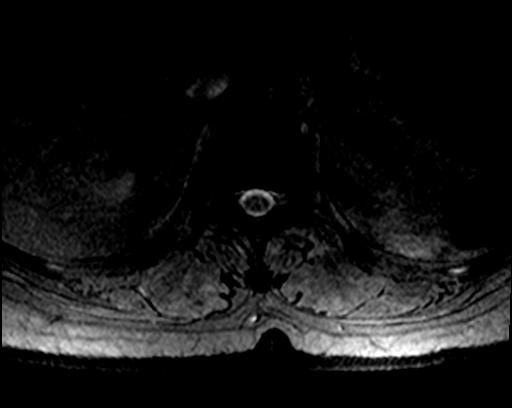
[im 18/36]
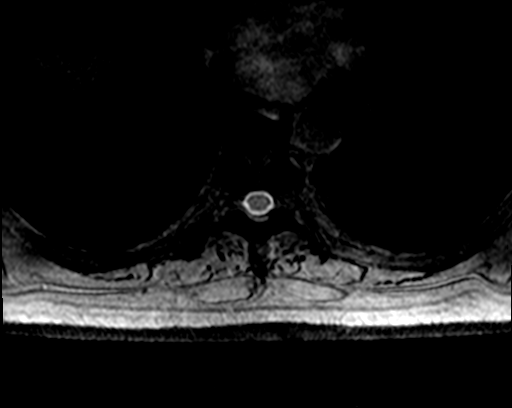
[im 30/36]
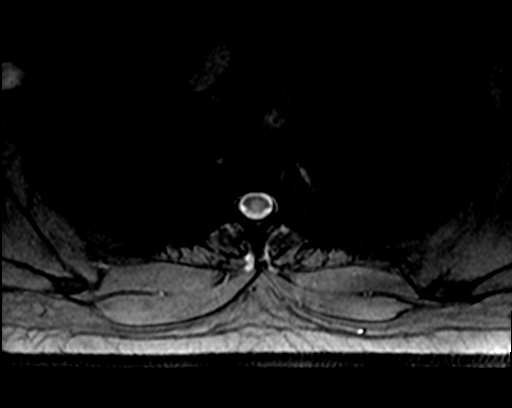

[21 of 48 positions shown; findings below may reference images not displayed]

FINDINGS: Alignment:  Normal thoracic alignment.

Vertebrae: No fracture or focal bone lesion.

Cord: Abnormal T2 signal within the cord from the T2-3 disc level to
the T3-4 disc level. Most likely diagnosis is demyelinating disease.
Transverse myelitis could be considered. This does not look like a
tumor.

Paraspinal and other soft tissues: Normal

Disc levels:

No disc level pathology of significance. Minimal non-compressive
disc bulges at T4-5 through T7-8.
IMPRESSION: Abnormal T2 signal within the cord from the T2-3 disc level to the
T3-4 disc level. Most likely diagnosis is demyelinating disease.
Transverse myelitis could have a similar appearance.

## 2022-02-19 DIAGNOSIS — G959 Disease of spinal cord, unspecified: Secondary | ICD-10-CM | POA: Diagnosis not present

## 2022-02-19 DIAGNOSIS — R200 Anesthesia of skin: Secondary | ICD-10-CM | POA: Diagnosis not present

## 2022-02-19 DIAGNOSIS — G379 Demyelinating disease of central nervous system, unspecified: Secondary | ICD-10-CM | POA: Diagnosis not present

## 2022-02-19 DIAGNOSIS — Z6831 Body mass index (BMI) 31.0-31.9, adult: Secondary | ICD-10-CM | POA: Diagnosis not present

## 2022-02-22 ENCOUNTER — Other Ambulatory Visit: Payer: Self-pay | Admitting: Neurological Surgery

## 2022-02-22 DIAGNOSIS — G379 Demyelinating disease of central nervous system, unspecified: Secondary | ICD-10-CM

## 2022-03-01 ENCOUNTER — Encounter: Payer: Self-pay | Admitting: Neurology

## 2022-03-02 DIAGNOSIS — G43709 Chronic migraine without aura, not intractable, without status migrainosus: Secondary | ICD-10-CM | POA: Diagnosis not present

## 2022-03-02 DIAGNOSIS — I10 Essential (primary) hypertension: Secondary | ICD-10-CM | POA: Diagnosis not present

## 2022-03-05 ENCOUNTER — Other Ambulatory Visit: Payer: Self-pay | Admitting: Family Medicine

## 2022-03-05 DIAGNOSIS — E2839 Other primary ovarian failure: Secondary | ICD-10-CM

## 2022-03-16 ENCOUNTER — Ambulatory Visit
Admission: RE | Admit: 2022-03-16 | Discharge: 2022-03-16 | Disposition: A | Discharge status: Home / Self Care | Payer: BC Managed Care – PPO | Source: Ambulatory Visit | Attending: Neurological Surgery | Admitting: Neurological Surgery

## 2022-03-16 DIAGNOSIS — G379 Demyelinating disease of central nervous system, unspecified: Secondary | ICD-10-CM

## 2022-03-16 DIAGNOSIS — G939 Disorder of brain, unspecified: Secondary | ICD-10-CM | POA: Diagnosis not present

## 2022-03-16 MED ORDER — GADOBENATE DIMEGLUMINE 529 MG/ML IV SOLN
16.0000 mL | Freq: Once | INTRAVENOUS | Status: AC | PRN
  Administered 2022-03-16: 16 mL via INTRAVENOUS

## 2022-03-20 DIAGNOSIS — E785 Hyperlipidemia, unspecified: Secondary | ICD-10-CM | POA: Diagnosis not present

## 2022-03-20 DIAGNOSIS — E119 Type 2 diabetes mellitus without complications: Secondary | ICD-10-CM | POA: Diagnosis not present

## 2022-03-20 DIAGNOSIS — I10 Essential (primary) hypertension: Secondary | ICD-10-CM | POA: Diagnosis not present

## 2022-03-20 DIAGNOSIS — M549 Dorsalgia, unspecified: Secondary | ICD-10-CM | POA: Diagnosis not present

## 2022-03-23 ENCOUNTER — Other Ambulatory Visit: Payer: Self-pay | Admitting: Family Medicine

## 2022-03-23 DIAGNOSIS — Z1231 Encounter for screening mammogram for malignant neoplasm of breast: Secondary | ICD-10-CM

## 2022-03-27 ENCOUNTER — Ambulatory Visit
Admission: RE | Admit: 2022-03-27 | Discharge: 2022-03-27 | Disposition: A | Discharge status: Home / Self Care | Payer: BC Managed Care – PPO | Source: Ambulatory Visit | Attending: Family Medicine | Admitting: Family Medicine

## 2022-03-27 DIAGNOSIS — Z1231 Encounter for screening mammogram for malignant neoplasm of breast: Secondary | ICD-10-CM | POA: Diagnosis not present

## 2022-03-27 IMAGING — MG MM DIGITAL SCREENING BILAT W/ TOMO AND CAD
8 series · 8 of 24 positions shown · non-contrast
Comparison: Previous exam(s).

CLINICAL DATA: Screening.

EXAM:
DIGITAL SCREENING BILATERAL MAMMOGRAM WITH TOMOSYNTHESIS AND CAD
TECHNIQUE: Bilateral screening digital craniocaudal and mediolateral oblique
mammograms were obtained. Bilateral screening digital breast
tomosynthesis was performed. The images were evaluated with
computer-aided detection.

[R CC synth-2D]
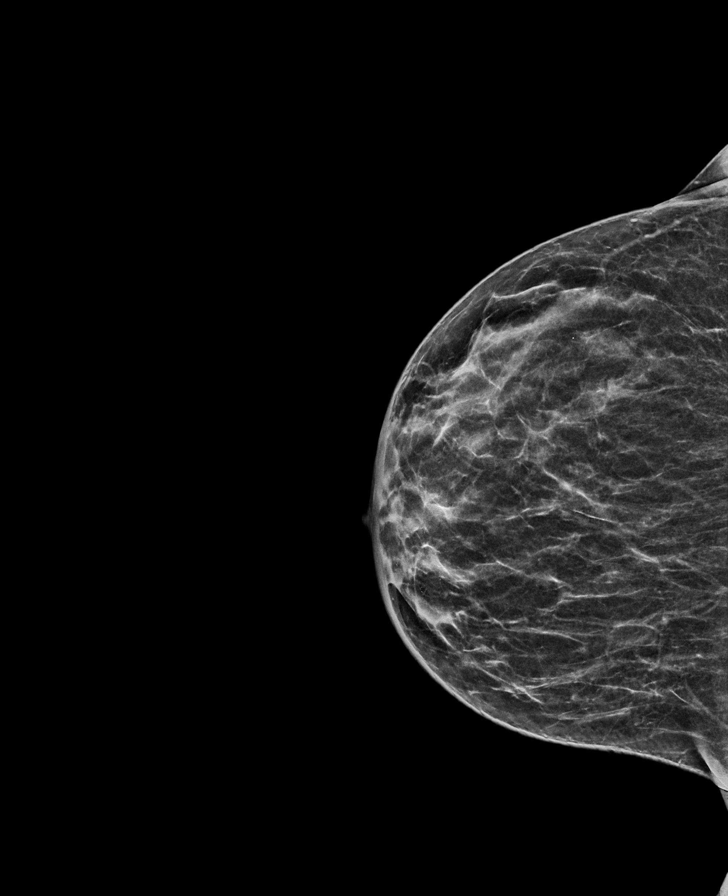

[L MLO synth-2D]
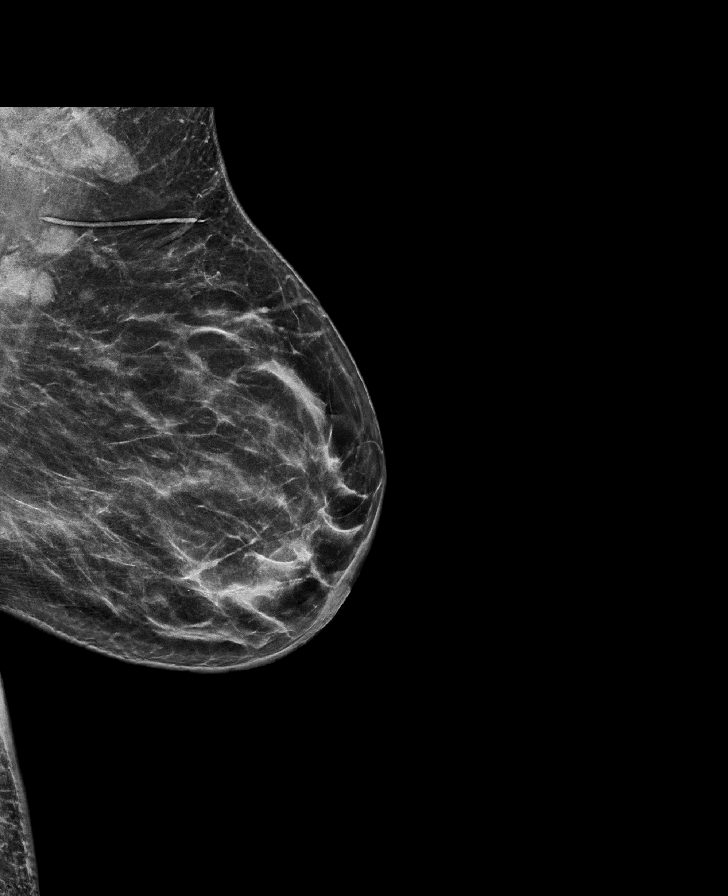

[R MLO synth-2D]
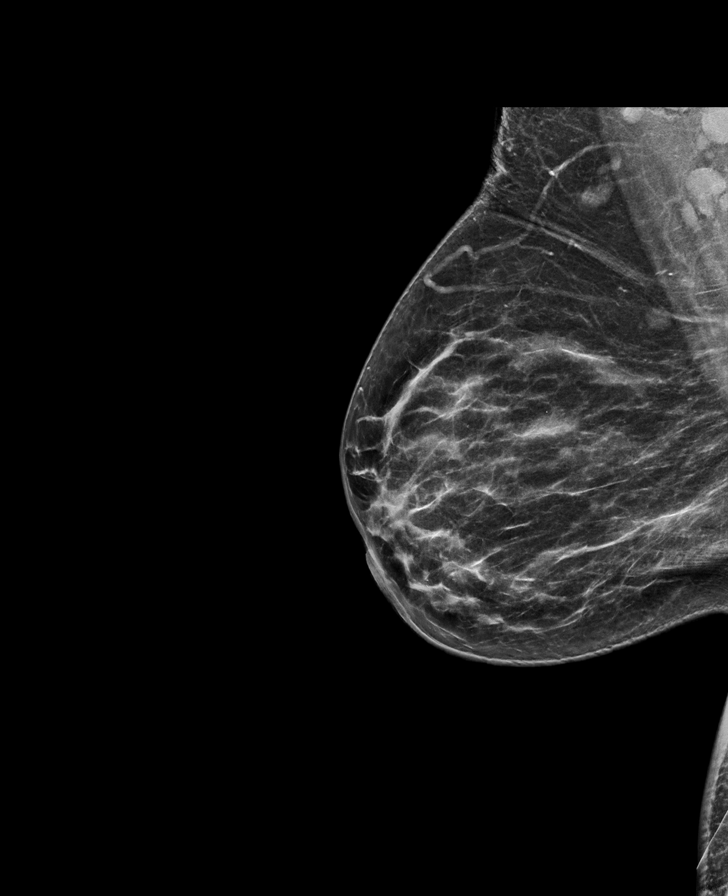

[L CC synth-2D]
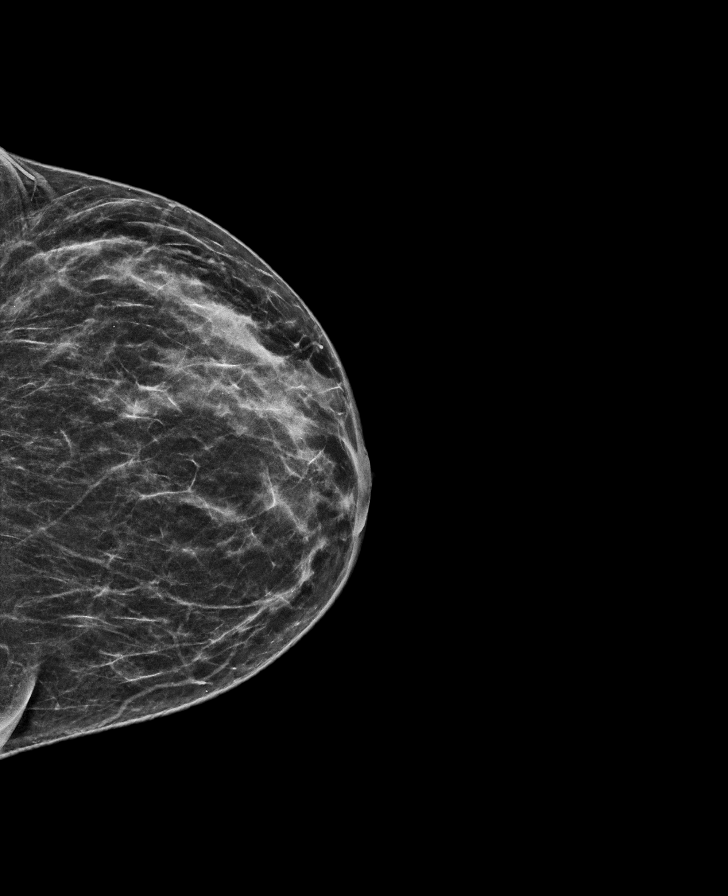

[L CC tomo · tomo slice 24/47.0]
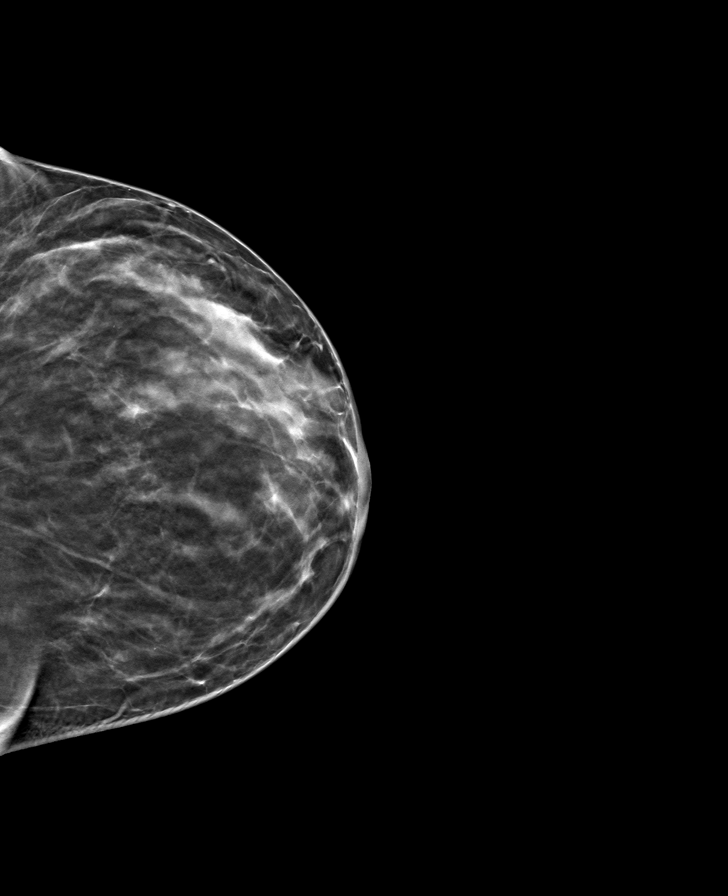

[R CC tomo · tomo slice 25/48.0]
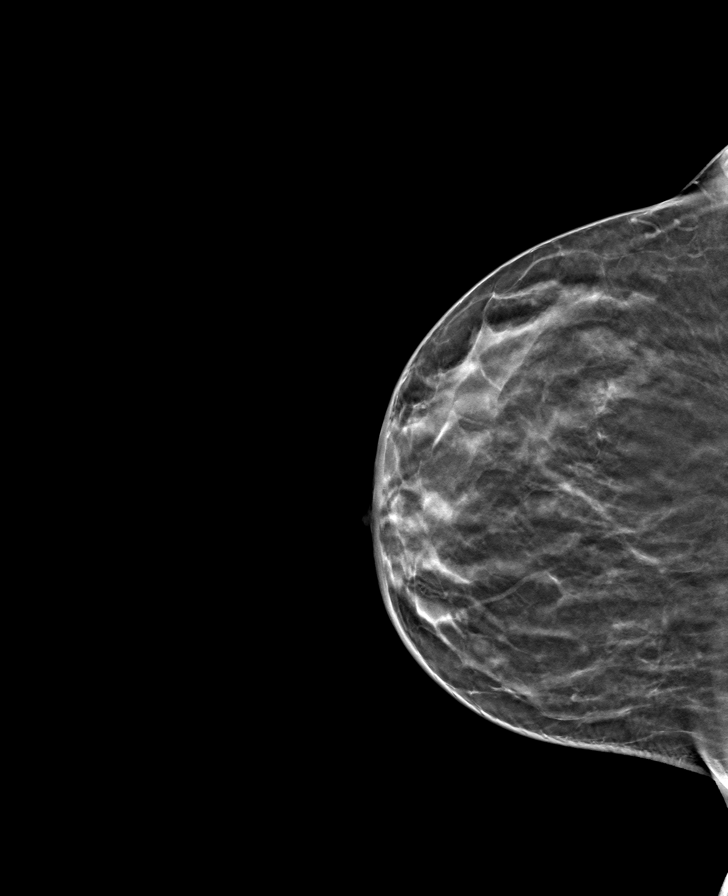

[R MLO tomo · tomo slice 32/63.0]
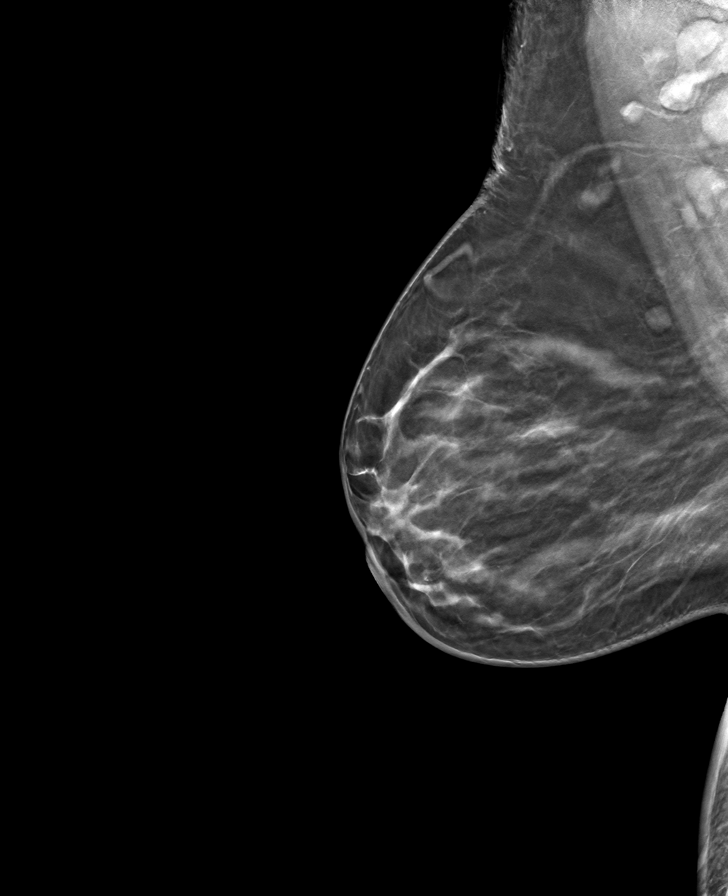

[L MLO tomo · tomo slice 33/64.0]
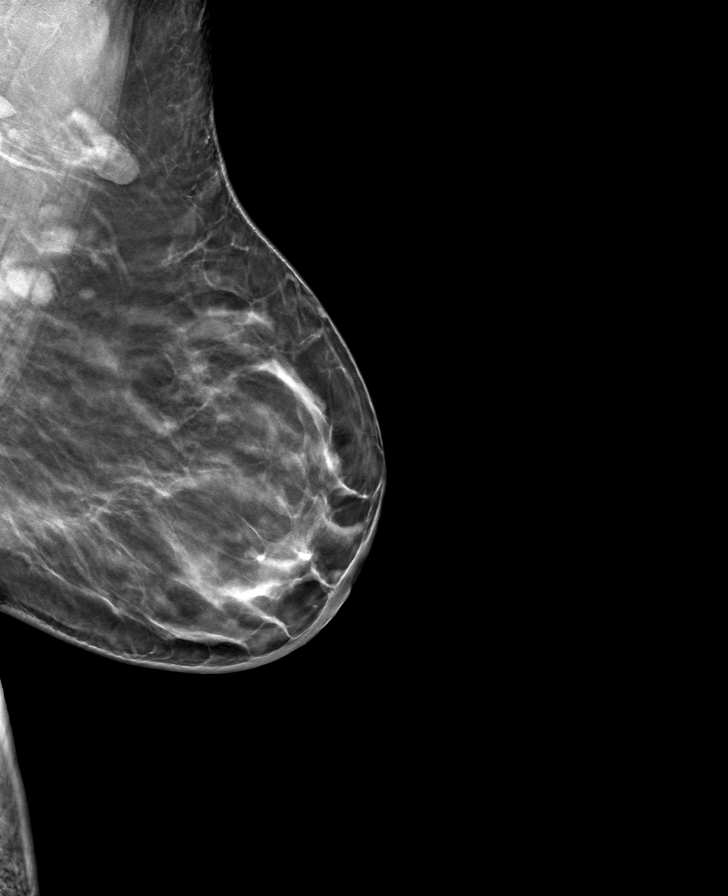

[8 of 24 positions shown; findings below may reference images not displayed]

ACR Breast Density Category c: The breast tissue is heterogeneously
dense, which may obscure small masses.
FINDINGS: There are prominent lymph nodes possibly enlarged in the right
axilla. Prominent, possibly enlarged lymph nodes in the left axilla.
No abnormalities found within the breast.
IMPRESSION: Further evaluation is suggested for possible right axillary
lymphadenopathy in the right breast.

Further evaluation is suggested for possible left axillary
lymphadenopathy in the left breast.

RECOMMENDATION:
Bilateral axillary ultrasound.

The patient will be contacted regarding the findings, and additional
imaging will be scheduled.

BI-RADS CATEGORY  0: Incomplete. Need additional imaging evaluation
and/or prior mammograms for comparison.

## 2022-03-28 DIAGNOSIS — G379 Demyelinating disease of central nervous system, unspecified: Secondary | ICD-10-CM | POA: Diagnosis not present

## 2022-03-28 DIAGNOSIS — G959 Disease of spinal cord, unspecified: Secondary | ICD-10-CM | POA: Diagnosis not present

## 2022-03-28 DIAGNOSIS — Z6831 Body mass index (BMI) 31.0-31.9, adult: Secondary | ICD-10-CM | POA: Diagnosis not present

## 2022-03-29 ENCOUNTER — Other Ambulatory Visit: Payer: Self-pay | Admitting: Family Medicine

## 2022-03-29 DIAGNOSIS — R928 Other abnormal and inconclusive findings on diagnostic imaging of breast: Secondary | ICD-10-CM

## 2022-04-16 ENCOUNTER — Other Ambulatory Visit: Payer: Self-pay | Admitting: General Practice

## 2022-04-16 ENCOUNTER — Ambulatory Visit
Admission: RE | Admit: 2022-04-16 | Discharge: 2022-04-16 | Disposition: A | Discharge status: Home / Self Care | Payer: BC Managed Care – PPO | Source: Ambulatory Visit | Attending: Family Medicine | Admitting: Family Medicine

## 2022-04-16 ENCOUNTER — Other Ambulatory Visit: Payer: Self-pay | Admitting: Family Medicine

## 2022-04-16 ENCOUNTER — Other Ambulatory Visit: Payer: BC Managed Care – PPO

## 2022-04-16 ENCOUNTER — Other Ambulatory Visit (HOSPITAL_COMMUNITY)
Admission: RE | Admit: 2022-04-16 | Discharge: 2022-04-16 | Disposition: A | Discharge status: Home / Self Care | Payer: BC Managed Care – PPO | Source: Ambulatory Visit | Attending: General Practice | Admitting: General Practice

## 2022-04-16 DIAGNOSIS — R599 Enlarged lymph nodes, unspecified: Secondary | ICD-10-CM | POA: Diagnosis not present

## 2022-04-16 DIAGNOSIS — R928 Other abnormal and inconclusive findings on diagnostic imaging of breast: Secondary | ICD-10-CM

## 2022-04-16 DIAGNOSIS — R590 Localized enlarged lymph nodes: Secondary | ICD-10-CM | POA: Diagnosis not present

## 2022-04-16 IMAGING — US US AXILLARY LEFT
1 series · 4 of 4 positions shown · non-contrast
Comparison: Prior studies.

CLINICAL DATA: 60-year-old female recalled from screening mammogram
dated [DATE] for possible bilateral lymphadenopathy.

EXAM:
ULTRASOUND OF THE BILATERAL AXILLA

[Series 1: us axillary left · 0.07mm/px · 4 of 4 slices shown]
[im 1/4]
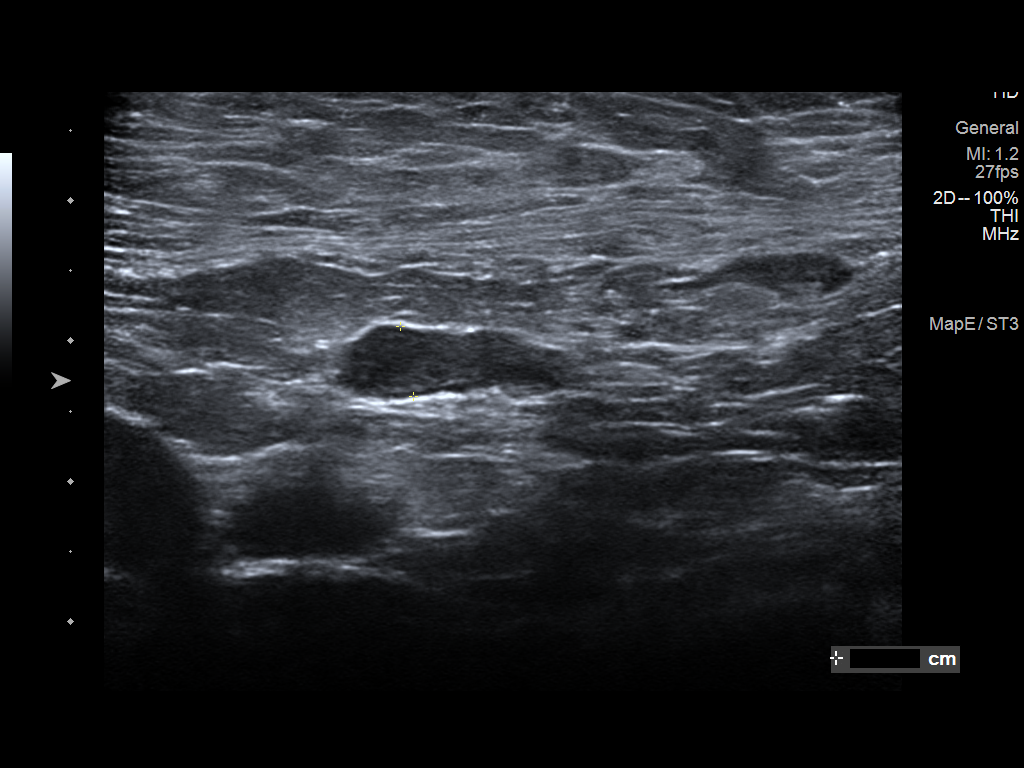
[im 2/4]
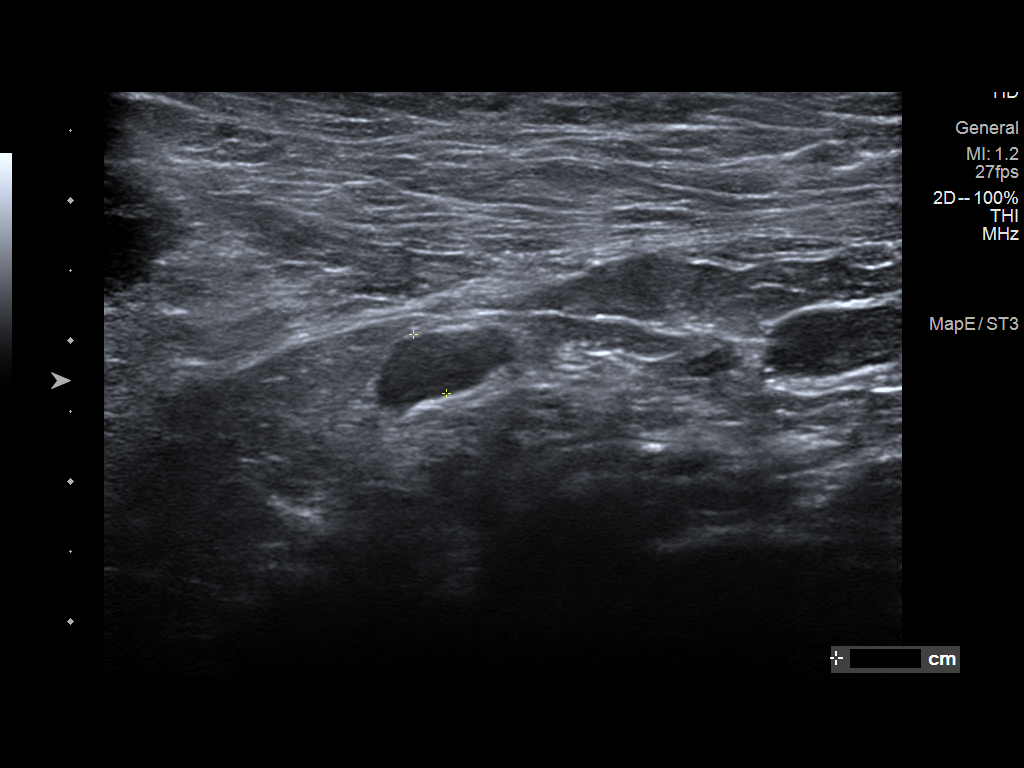
[im 3/4]
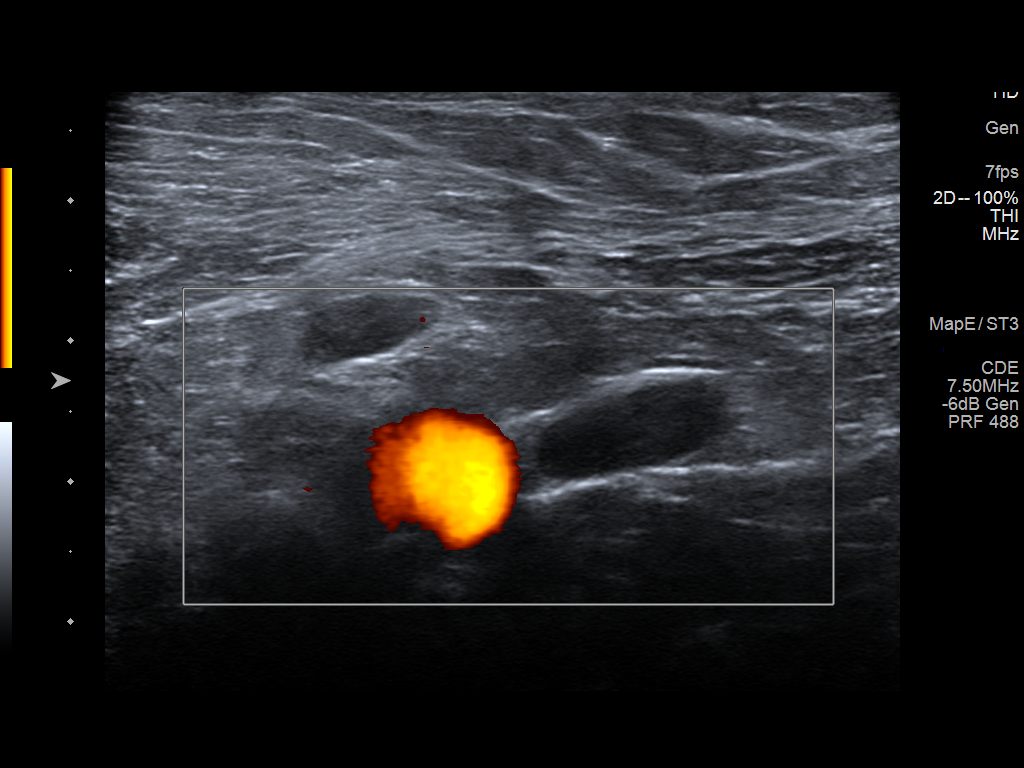
[im 4/4]
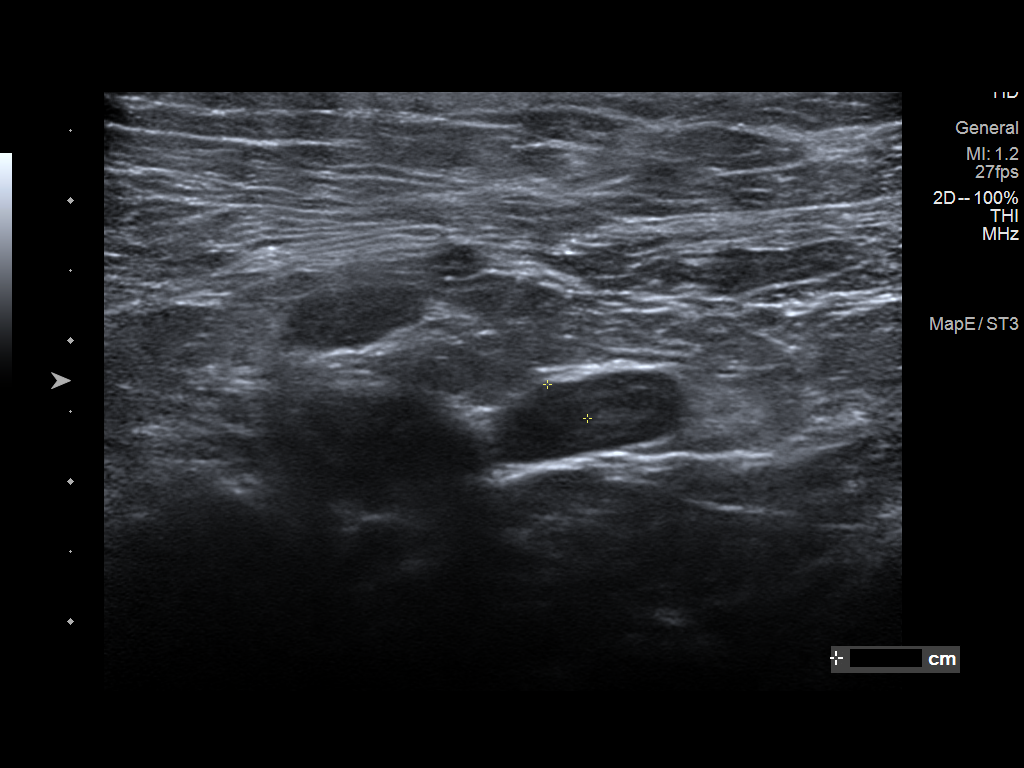

[4 of 4 positions shown; findings below may reference images not displayed]

FINDINGS: Ultrasound is performed, showing diffuse cortical thickening of
bilateral axillary lymph nodes to 5-6 mm.
IMPRESSION: Diffuse cortical thickening of bilateral axillary lymph nodes. This
is an indeterminate finding most suggestive of a lymphoproliferative
process.

RECOMMENDATION:
Ultrasound-guided biopsy of a right axillary lymph node. Sample
should be sent in both saline and formalin for tissue analysis.

I have discussed the findings and recommendations with the patient.
If applicable, a reminder letter will be sent to the patient
regarding the next appointment.

BI-RADS CATEGORY  4: Suspicious.

## 2022-04-16 IMAGING — US US AXILLARY NODE CORE BIOPSY RIGHT
1 series · 13 of 17 positions shown · non-contrast
Comparison: None Available.
COMPARISON: None Available.

Addendum:
CLINICAL DATA: 60-year-old female presenting for biopsy of a right
axillary lymph node.

EXAM:
US AXILLARY NODE CORE BIOPSY RIGHT

[Series 1: us axillary node core biopsy right · 0.07mm/px · 13 of 17 slices shown]
[im 1/17]
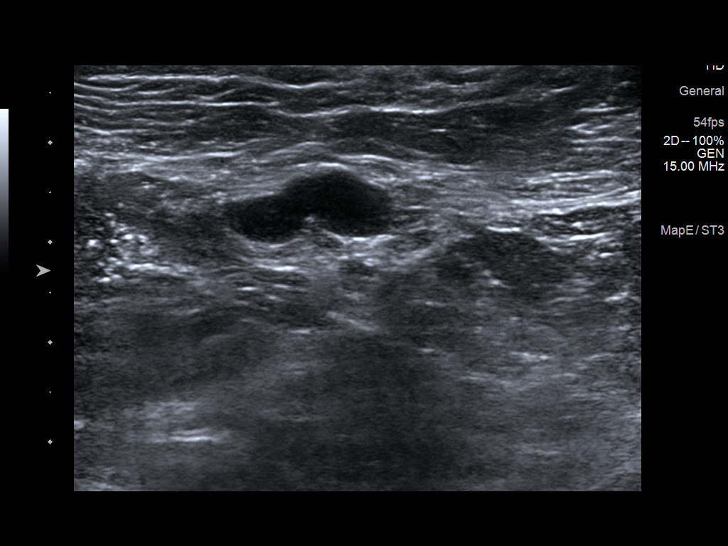
[im 2/17]
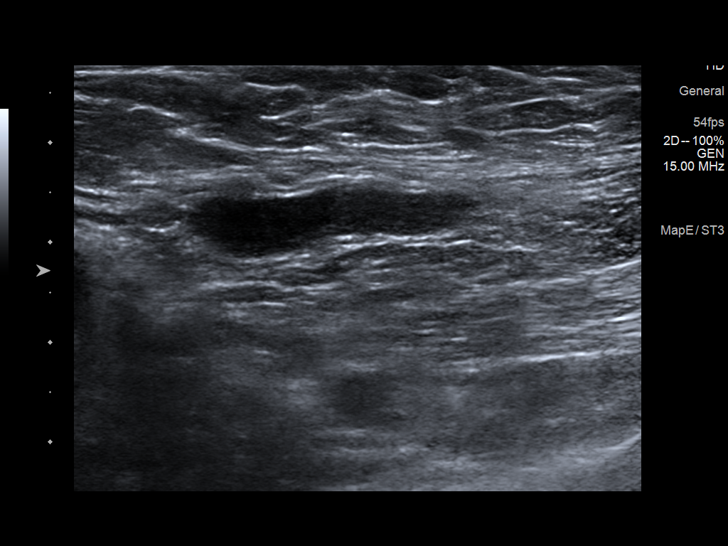
[im 4/17]
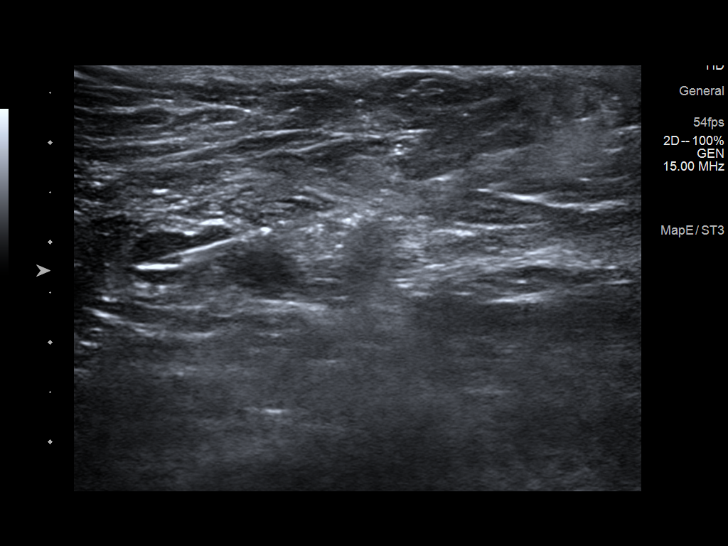
[im 5/17]
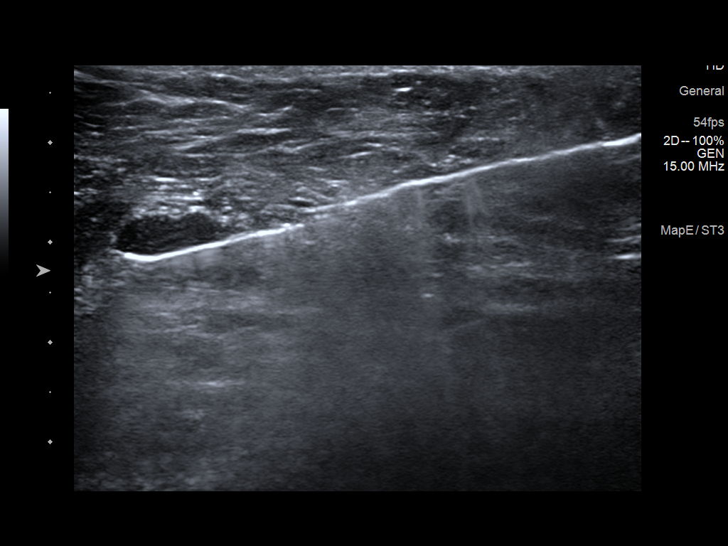
[im 6/17]
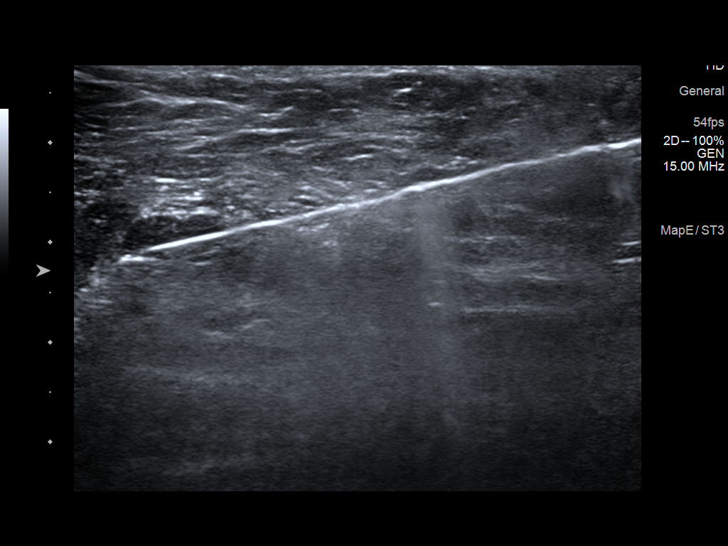
[im 8/17]
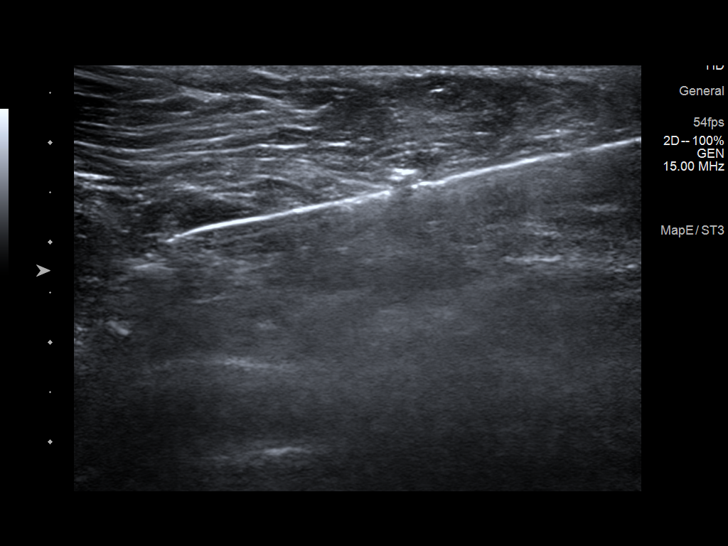
[im 9/17]
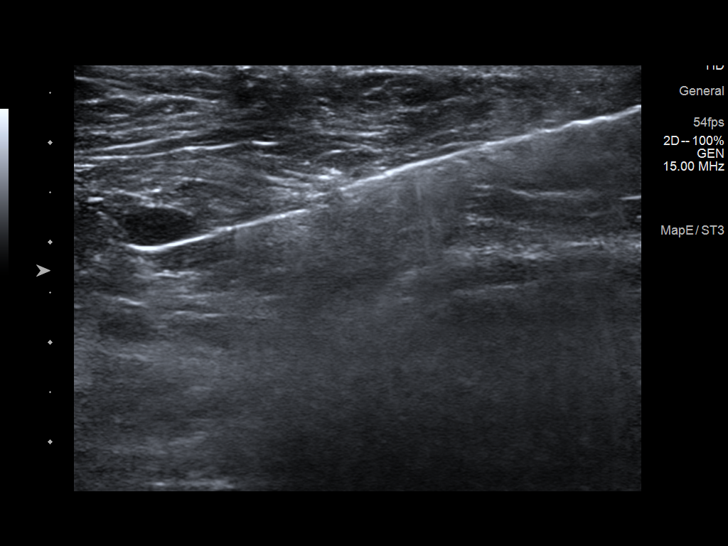
[im 10/17]
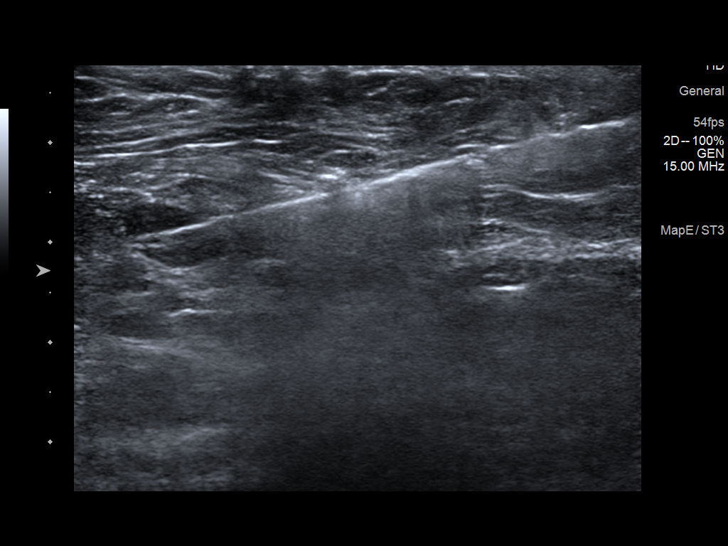
[im 12/17]
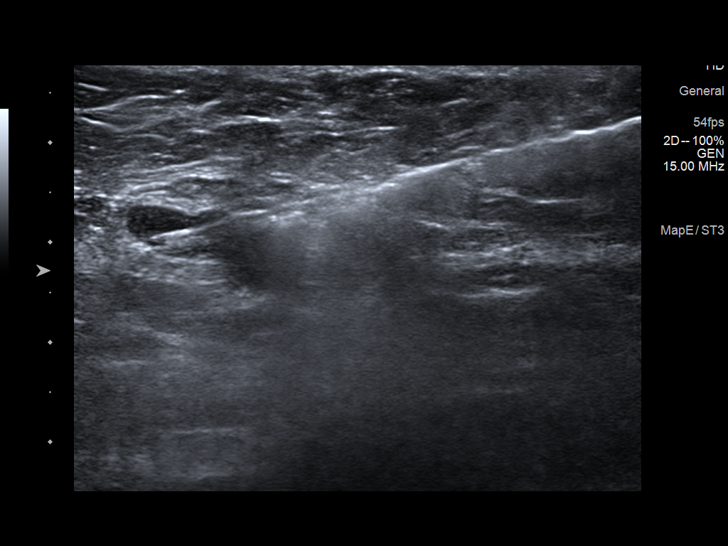
[im 13/17]
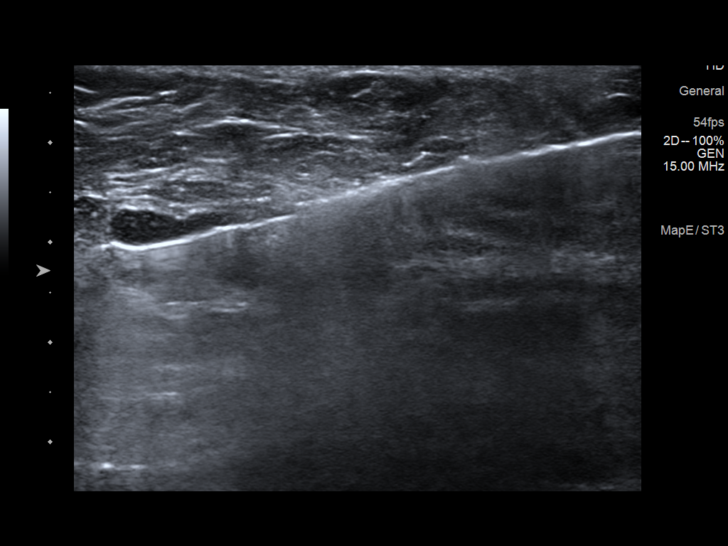
[im 14/17]
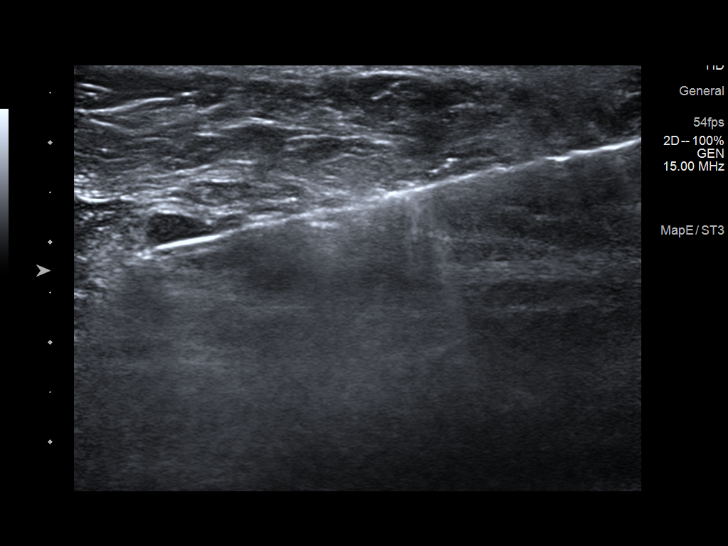
[im 16/17]
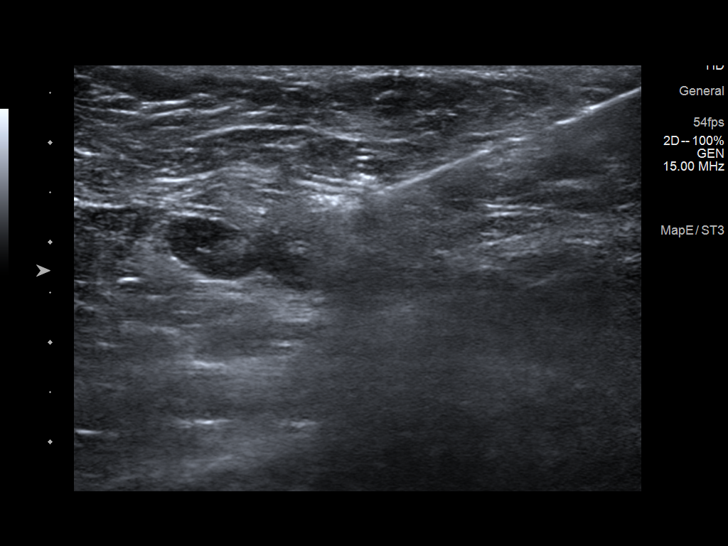
[im 17/17]
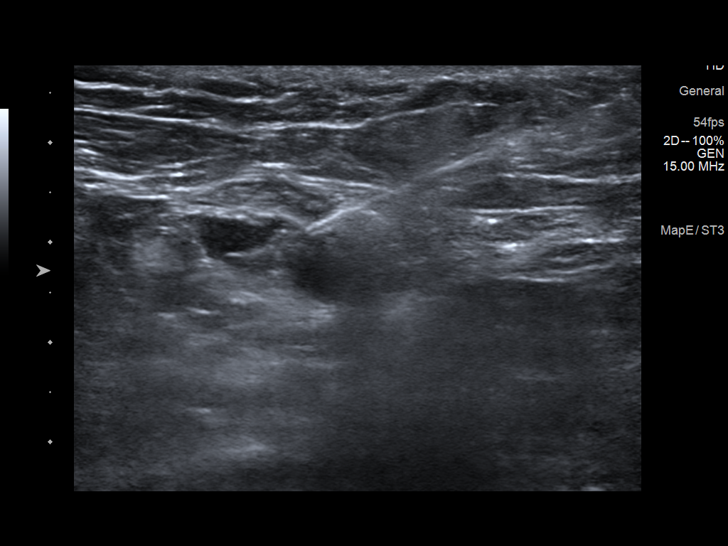

[13 of 17 positions shown; findings below may reference images not displayed]



Using sterile technique and 1% Lidocaine as local anesthetic, under
direct ultrasound visualization, a 14 gauge HARZI device was
used to perform biopsy of a right axillary lymph node using a
lateral approach. At the conclusion of the procedure a HydroMARK
tissue marker clip was deployed into the biopsy cavity.
IMPRESSION: Ultrasound guided biopsy of a right axillary lymph node. No apparent
complications.

ADDENDUM:
Pathology revealed REACTIVE LYMPHOID HYPERPLASIA of the RIGHT
axilla, (hydromark clip). Flow cytometric analysis was performed and
failed to show any monoclonal B-cell population or abnormal T-cell
phenotype. The findings are nonspecific but consistent with reactive
lymphoid hyperplasia. This was found to be concordant by Dr. HARZI
HARZI.

Pathology results were discussed with the patient by telephone. The
patient reported doing well after the biopsy with tenderness at the
site. Post biopsy instructions and care were reviewed and questions
were answered. The patient was encouraged to call The [REDACTED] for any additional concerns. My direct phone
number was provided.

The patient was instructed to continue with monthly self breast and
axillary examinations, clinical follow-up as needed, and to return
for annual screening mammography in [DATE].

Pathology results reported by HARZI, RN on [DATE].



Using sterile technique and 1% Lidocaine as local anesthetic, under
direct ultrasound visualization, a 14 gauge HARZI device was
used to perform biopsy of a right axillary lymph node using a
lateral approach. At the conclusion of the procedure a HydroMARK
tissue marker clip was deployed into the biopsy cavity.
IMPRESSION: Ultrasound guided biopsy of a right axillary lymph node. No apparent
complications.

## 2022-04-16 IMAGING — US US AXILLARY RIGHT
1 series · 5 of 5 positions shown · non-contrast
Comparison: Prior studies.

CLINICAL DATA: 60-year-old female recalled from screening mammogram
dated [DATE] for possible bilateral lymphadenopathy.

EXAM:
ULTRASOUND OF THE BILATERAL AXILLA

[Series 1: us axillary right · 0.07mm/px · 5 of 5 slices shown]
[im 1/5]
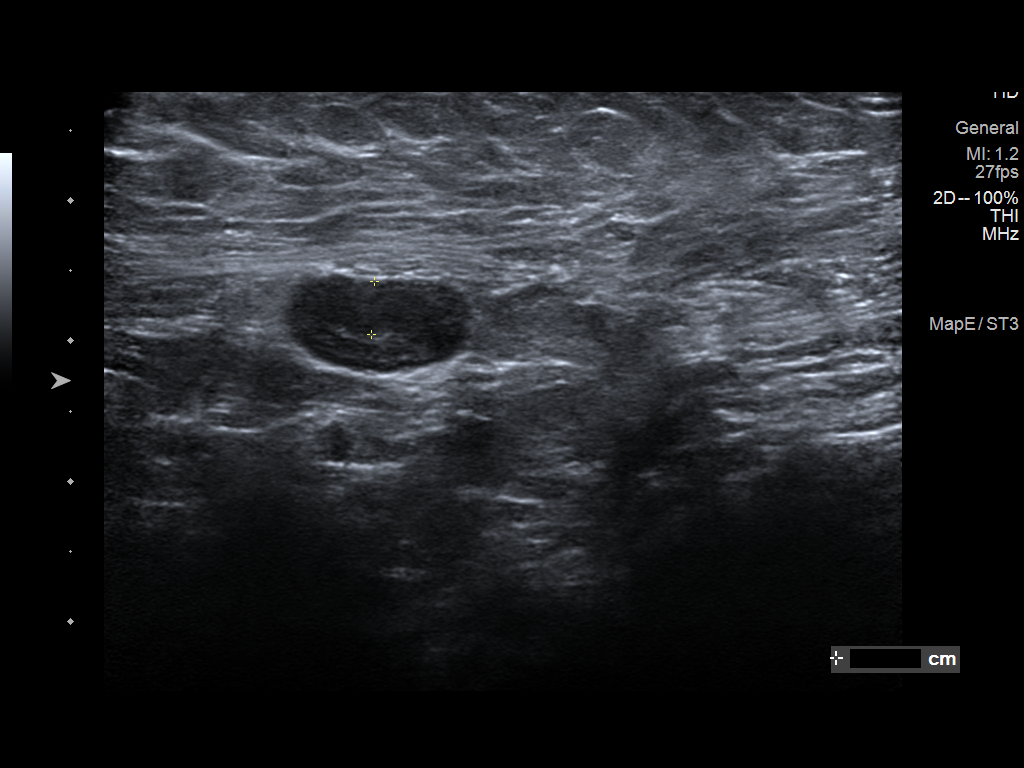
[im 2/5]
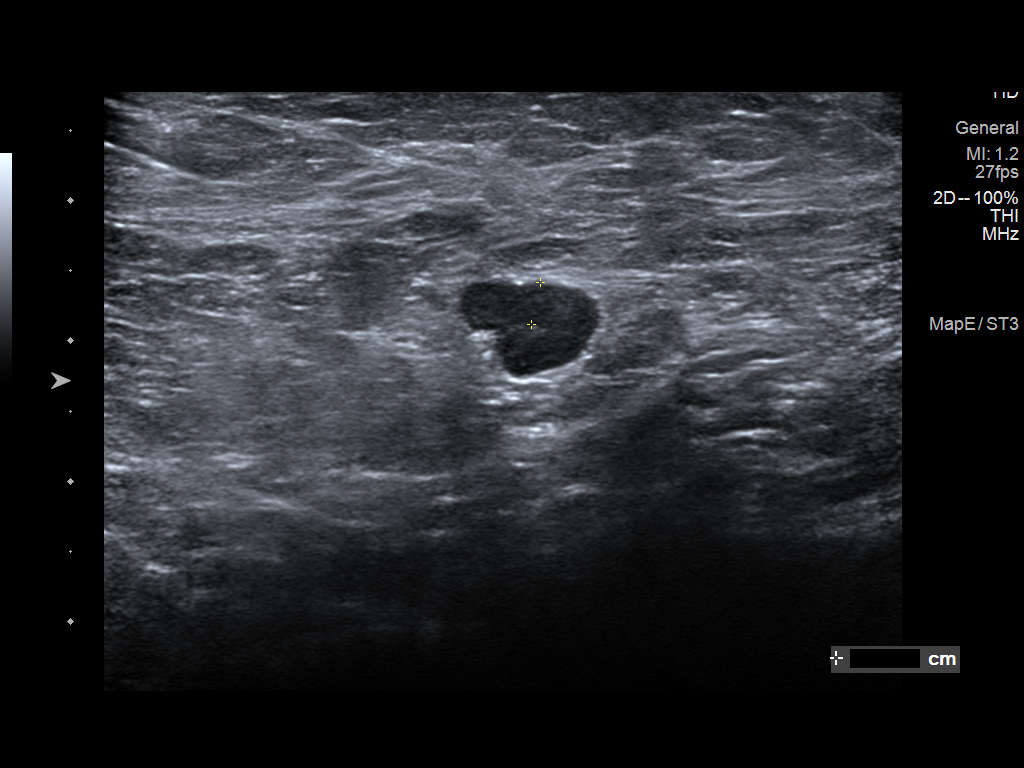
[im 3/5]
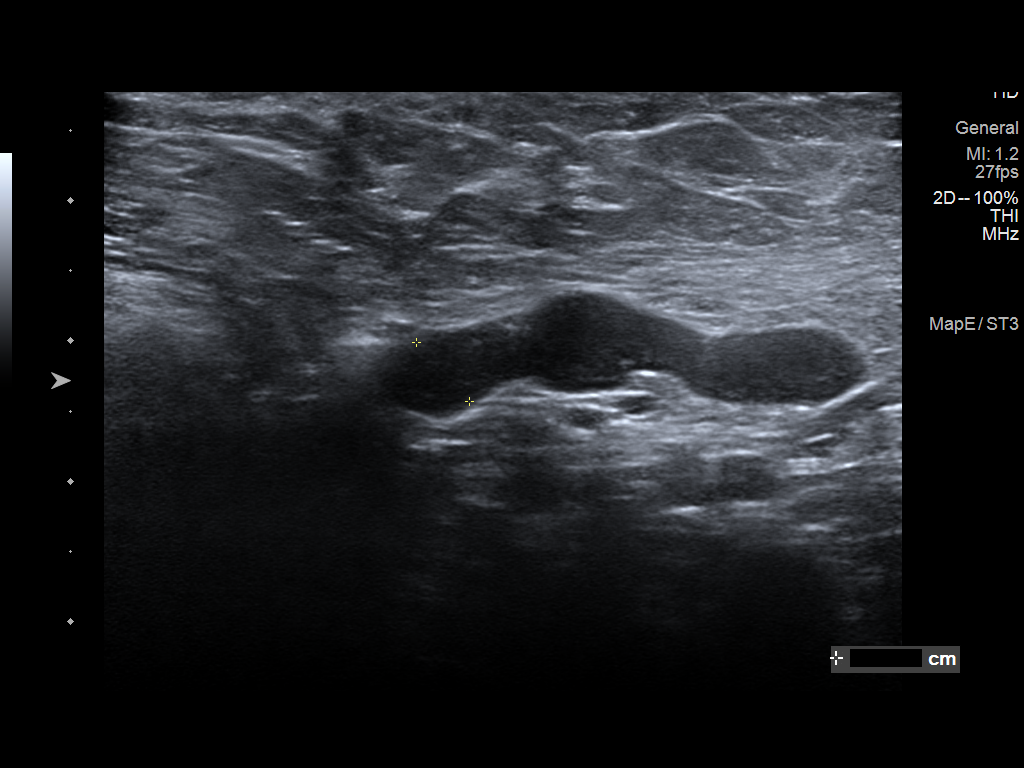
[im 4/5]
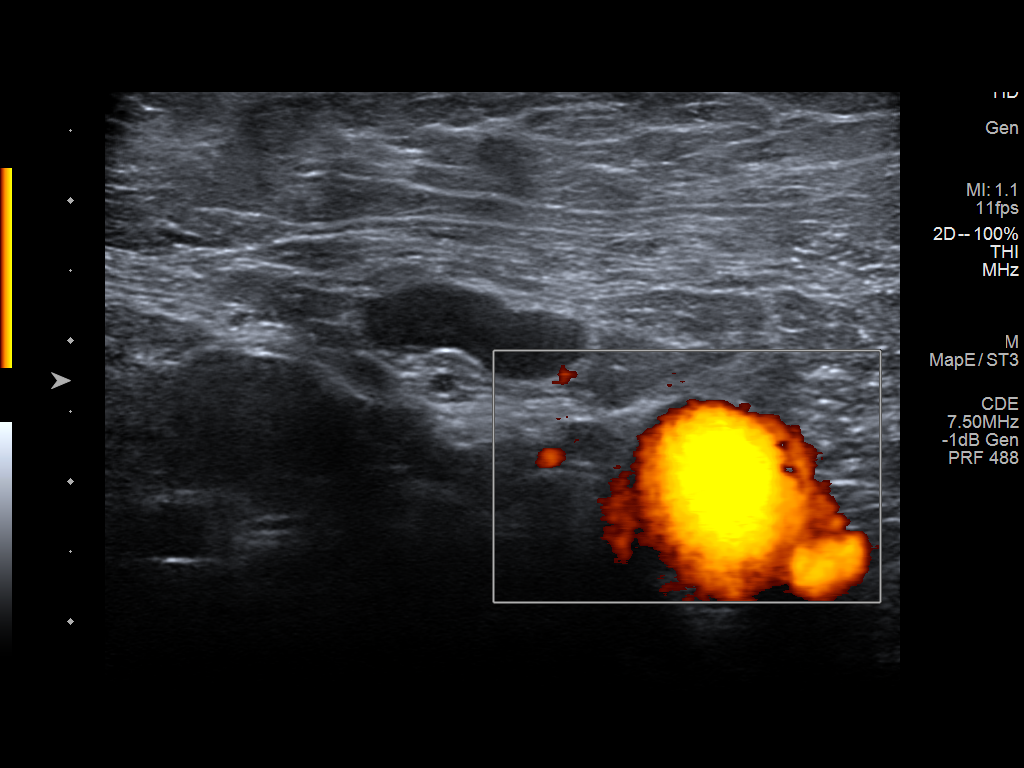
[im 5/5]
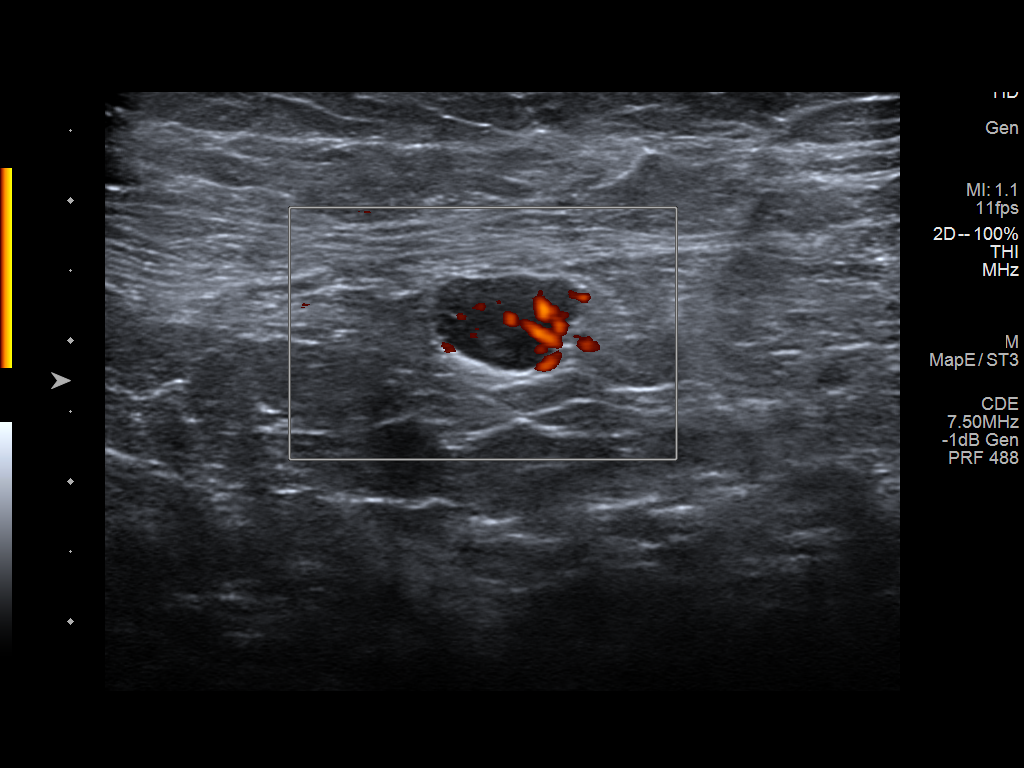

[5 of 5 positions shown; findings below may reference images not displayed]

FINDINGS: Ultrasound is performed, showing diffuse cortical thickening of
bilateral axillary lymph nodes to 5-6 mm.
IMPRESSION: Diffuse cortical thickening of bilateral axillary lymph nodes. This
is an indeterminate finding most suggestive of a lymphoproliferative
process.

RECOMMENDATION:
Ultrasound-guided biopsy of a right axillary lymph node. Sample
should be sent in both saline and formalin for tissue analysis.

I have discussed the findings and recommendations with the patient.
If applicable, a reminder letter will be sent to the patient
regarding the next appointment.

BI-RADS CATEGORY  4: Suspicious.

## 2022-04-17 DIAGNOSIS — G43111 Migraine with aura, intractable, with status migrainosus: Secondary | ICD-10-CM | POA: Diagnosis not present

## 2022-04-17 DIAGNOSIS — G379 Demyelinating disease of central nervous system, unspecified: Secondary | ICD-10-CM | POA: Diagnosis not present

## 2022-04-17 LAB — SURGICAL PATHOLOGY

## 2022-04-24 DIAGNOSIS — I10 Essential (primary) hypertension: Secondary | ICD-10-CM | POA: Diagnosis not present

## 2022-04-24 DIAGNOSIS — R000 Tachycardia, unspecified: Secondary | ICD-10-CM | POA: Diagnosis not present

## 2022-06-27 DIAGNOSIS — G379 Demyelinating disease of central nervous system, unspecified: Secondary | ICD-10-CM | POA: Diagnosis not present

## 2022-06-27 DIAGNOSIS — Z885 Allergy status to narcotic agent status: Secondary | ICD-10-CM | POA: Diagnosis not present

## 2022-07-18 DIAGNOSIS — E119 Type 2 diabetes mellitus without complications: Secondary | ICD-10-CM | POA: Diagnosis not present

## 2022-07-18 DIAGNOSIS — Z23 Encounter for immunization: Secondary | ICD-10-CM | POA: Diagnosis not present

## 2022-07-18 DIAGNOSIS — I10 Essential (primary) hypertension: Secondary | ICD-10-CM | POA: Diagnosis not present

## 2022-07-18 DIAGNOSIS — E785 Hyperlipidemia, unspecified: Secondary | ICD-10-CM | POA: Diagnosis not present

## 2022-07-23 ENCOUNTER — Ambulatory Visit: Payer: BC Managed Care – PPO | Admitting: Neurology

## 2022-08-03 DIAGNOSIS — G35 Multiple sclerosis: Secondary | ICD-10-CM | POA: Diagnosis not present

## 2022-08-03 DIAGNOSIS — Z6829 Body mass index (BMI) 29.0-29.9, adult: Secondary | ICD-10-CM | POA: Diagnosis not present

## 2022-08-23 ENCOUNTER — Ambulatory Visit
Admission: RE | Admit: 2022-08-23 | Discharge: 2022-08-23 | Disposition: A | Discharge status: Home / Self Care | Payer: BC Managed Care – PPO | Source: Ambulatory Visit | Attending: Family Medicine | Admitting: Family Medicine

## 2022-08-23 DIAGNOSIS — Z780 Asymptomatic menopausal state: Secondary | ICD-10-CM | POA: Diagnosis not present

## 2022-08-23 DIAGNOSIS — M85851 Other specified disorders of bone density and structure, right thigh: Secondary | ICD-10-CM | POA: Diagnosis not present

## 2022-08-23 DIAGNOSIS — E2839 Other primary ovarian failure: Secondary | ICD-10-CM

## 2022-08-28 ENCOUNTER — Other Ambulatory Visit (HOSPITAL_COMMUNITY)
Admission: RE | Admit: 2022-08-28 | Discharge: 2022-08-28 | Disposition: A | Discharge status: Home / Self Care | Payer: BC Managed Care – PPO | Source: Ambulatory Visit | Attending: Family Medicine | Admitting: Family Medicine

## 2022-08-28 ENCOUNTER — Other Ambulatory Visit: Payer: Self-pay | Admitting: Family Medicine

## 2022-08-28 DIAGNOSIS — E785 Hyperlipidemia, unspecified: Secondary | ICD-10-CM | POA: Diagnosis not present

## 2022-08-28 DIAGNOSIS — G43109 Migraine with aura, not intractable, without status migrainosus: Secondary | ICD-10-CM | POA: Diagnosis not present

## 2022-08-28 DIAGNOSIS — Z01419 Encounter for gynecological examination (general) (routine) without abnormal findings: Secondary | ICD-10-CM | POA: Insufficient documentation

## 2022-08-28 DIAGNOSIS — Z23 Encounter for immunization: Secondary | ICD-10-CM | POA: Diagnosis not present

## 2022-08-28 DIAGNOSIS — E1165 Type 2 diabetes mellitus with hyperglycemia: Secondary | ICD-10-CM | POA: Diagnosis not present

## 2022-08-28 DIAGNOSIS — Z0000 Encounter for general adult medical examination without abnormal findings: Secondary | ICD-10-CM | POA: Diagnosis not present

## 2022-08-28 DIAGNOSIS — I10 Essential (primary) hypertension: Secondary | ICD-10-CM | POA: Diagnosis not present

## 2022-09-05 LAB — CYTOLOGY - PAP
Comment: NEGATIVE
Diagnosis: UNDETERMINED — AB
High risk HPV: NEGATIVE

## 2022-12-03 DIAGNOSIS — Z5181 Encounter for therapeutic drug level monitoring: Secondary | ICD-10-CM | POA: Diagnosis not present

## 2022-12-03 DIAGNOSIS — G35 Multiple sclerosis: Secondary | ICD-10-CM | POA: Diagnosis not present

## 2022-12-03 DIAGNOSIS — R3915 Urgency of urination: Secondary | ICD-10-CM | POA: Diagnosis not present

## 2022-12-03 DIAGNOSIS — G43111 Migraine with aura, intractable, with status migrainosus: Secondary | ICD-10-CM | POA: Diagnosis not present

## 2022-12-03 DIAGNOSIS — M792 Neuralgia and neuritis, unspecified: Secondary | ICD-10-CM | POA: Diagnosis not present

## 2023-01-02 DIAGNOSIS — G35 Multiple sclerosis: Secondary | ICD-10-CM | POA: Diagnosis not present

## 2023-01-02 DIAGNOSIS — M792 Neuralgia and neuritis, unspecified: Secondary | ICD-10-CM | POA: Diagnosis not present

## 2023-01-28 DIAGNOSIS — E785 Hyperlipidemia, unspecified: Secondary | ICD-10-CM | POA: Diagnosis not present

## 2023-01-28 DIAGNOSIS — Z23 Encounter for immunization: Secondary | ICD-10-CM | POA: Diagnosis not present

## 2023-01-28 DIAGNOSIS — E1165 Type 2 diabetes mellitus with hyperglycemia: Secondary | ICD-10-CM | POA: Diagnosis not present

## 2023-01-28 DIAGNOSIS — R262 Difficulty in walking, not elsewhere classified: Secondary | ICD-10-CM | POA: Diagnosis not present

## 2023-01-28 DIAGNOSIS — I10 Essential (primary) hypertension: Secondary | ICD-10-CM | POA: Diagnosis not present

## 2023-01-30 DIAGNOSIS — G35 Multiple sclerosis: Secondary | ICD-10-CM | POA: Diagnosis not present

## 2023-02-01 ENCOUNTER — Emergency Department (HOSPITAL_COMMUNITY): Payer: BC Managed Care – PPO

## 2023-02-01 ENCOUNTER — Encounter (HOSPITAL_COMMUNITY): Payer: Self-pay

## 2023-02-01 ENCOUNTER — Other Ambulatory Visit: Payer: Self-pay

## 2023-02-01 ENCOUNTER — Inpatient Hospital Stay (HOSPITAL_COMMUNITY)
Admission: EM | Admit: 2023-02-01 | Discharge: 2023-02-03 | DRG: 059 | Disposition: A | Discharge status: Home Health | Payer: BC Managed Care – PPO | Attending: Internal Medicine | Admitting: Internal Medicine

## 2023-02-01 DIAGNOSIS — E538 Deficiency of other specified B group vitamins: Secondary | ICD-10-CM | POA: Diagnosis present

## 2023-02-01 DIAGNOSIS — I10 Essential (primary) hypertension: Secondary | ICD-10-CM | POA: Diagnosis not present

## 2023-02-01 DIAGNOSIS — Z885 Allergy status to narcotic agent status: Secondary | ICD-10-CM | POA: Diagnosis not present

## 2023-02-01 DIAGNOSIS — E860 Dehydration: Secondary | ICD-10-CM | POA: Diagnosis present

## 2023-02-01 DIAGNOSIS — Z794 Long term (current) use of insulin: Secondary | ICD-10-CM | POA: Diagnosis not present

## 2023-02-01 DIAGNOSIS — E785 Hyperlipidemia, unspecified: Secondary | ICD-10-CM | POA: Diagnosis present

## 2023-02-01 DIAGNOSIS — Z91040 Latex allergy status: Secondary | ICD-10-CM

## 2023-02-01 DIAGNOSIS — Z888 Allergy status to other drugs, medicaments and biological substances status: Secondary | ICD-10-CM | POA: Diagnosis not present

## 2023-02-01 DIAGNOSIS — Z79899 Other long term (current) drug therapy: Secondary | ICD-10-CM

## 2023-02-01 DIAGNOSIS — W19XXXA Unspecified fall, initial encounter: Principal | ICD-10-CM

## 2023-02-01 DIAGNOSIS — G35 Multiple sclerosis: Principal | ICD-10-CM | POA: Diagnosis present

## 2023-02-01 DIAGNOSIS — E119 Type 2 diabetes mellitus without complications: Secondary | ICD-10-CM | POA: Diagnosis present

## 2023-02-01 DIAGNOSIS — S0990XA Unspecified injury of head, initial encounter: Secondary | ICD-10-CM | POA: Diagnosis not present

## 2023-02-01 DIAGNOSIS — R531 Weakness: Secondary | ICD-10-CM

## 2023-02-01 DIAGNOSIS — R42 Dizziness and giddiness: Secondary | ICD-10-CM | POA: Diagnosis not present

## 2023-02-01 DIAGNOSIS — N179 Acute kidney failure, unspecified: Secondary | ICD-10-CM | POA: Diagnosis not present

## 2023-02-01 DIAGNOSIS — G43909 Migraine, unspecified, not intractable, without status migrainosus: Secondary | ICD-10-CM | POA: Diagnosis not present

## 2023-02-01 DIAGNOSIS — G8929 Other chronic pain: Secondary | ICD-10-CM | POA: Diagnosis not present

## 2023-02-01 DIAGNOSIS — Z7982 Long term (current) use of aspirin: Secondary | ICD-10-CM | POA: Diagnosis not present

## 2023-02-01 HISTORY — DX: Other cervical disc degeneration, unspecified cervical region: M50.30

## 2023-02-01 HISTORY — DX: Multiple sclerosis: G35

## 2023-02-01 LAB — BASIC METABOLIC PANEL
Anion gap: 13 (ref 5–15)
BUN: 14 mg/dL (ref 6–20)
CO2: 24 mmol/L (ref 22–32)
Calcium: 9.2 mg/dL (ref 8.9–10.3)
Chloride: 100 mmol/L (ref 98–111)
Creatinine, Ser: 1.1 mg/dL — ABNORMAL HIGH (ref 0.44–1.00)
GFR, Estimated: 58 mL/min — ABNORMAL LOW (ref >60)
Glucose, Bld: 118 mg/dL — ABNORMAL HIGH (ref 70–99)
Potassium: 3.6 mmol/L (ref 3.5–5.1)
Sodium: 137 mmol/L (ref 135–145)

## 2023-02-01 LAB — CBC
HCT: 35.7 % — ABNORMAL LOW (ref 36.0–46.0)
Hemoglobin: 11.7 g/dL — ABNORMAL LOW (ref 12.0–15.0)
MCH: 29 pg (ref 26.0–34.0)
MCHC: 32.8 g/dL (ref 30.0–36.0)
MCV: 88.4 fL (ref 80.0–100.0)
Platelets: 168 10*3/uL (ref 150–400)
RBC: 4.04 MIL/uL (ref 3.87–5.11)
RDW: 13.5 % (ref 11.5–15.5)
WBC: 9.7 10*3/uL (ref 4.0–10.5)
nRBC: 0 % (ref 0.0–0.2)

## 2023-02-01 LAB — GLUCOSE, CAPILLARY: Glucose-Capillary: 95 mg/dL (ref 70–99)

## 2023-02-01 LAB — HIV ANTIBODY (ROUTINE TESTING W REFLEX): HIV Screen 4th Generation wRfx: NONREACTIVE

## 2023-02-01 MED ORDER — HYDROCHLOROTHIAZIDE 25 MG PO TABS: 25.0000 mg | ORAL_TABLET | Freq: Every day | ORAL | Status: DC

## 2023-02-01 MED ORDER — TIZANIDINE HCL 2 MG PO TABS: 2.0000 mg | ORAL_TABLET | Freq: Three times a day (TID) | ORAL | Status: DC | PRN

## 2023-02-01 MED ORDER — POLYETHYLENE GLYCOL 3350 17 G PO PACK: 17.0000 g | PACK | Freq: Every day | ORAL | Status: DC | PRN

## 2023-02-01 MED ORDER — ENOXAPARIN SODIUM 40 MG/0.4ML IJ SOSY
40.0000 mg | PREFILLED_SYRINGE | INTRAMUSCULAR | Status: DC
  Administered 2023-02-01 – 2023-02-02 (×2): 40 mg via SUBCUTANEOUS
  Filled 2023-02-01 (×2): qty 0.4

## 2023-02-01 MED ORDER — INSULIN ASPART 100 UNIT/ML IJ SOLN
0.0000 [IU] | Freq: Three times a day (TID) | INTRAMUSCULAR | Status: DC
  Administered 2023-02-02 (×2): 2 [IU] via SUBCUTANEOUS

## 2023-02-01 MED ORDER — VALSARTAN-HYDROCHLOROTHIAZIDE 320-25 MG PO TABS: 1.0000 | ORAL_TABLET | Freq: Every day | ORAL | Status: DC

## 2023-02-01 MED ORDER — OXYCODONE HCL 5 MG PO TABS: 5.0000 mg | ORAL_TABLET | ORAL | Status: DC | PRN

## 2023-02-01 MED ORDER — GADOBUTROL 1 MMOL/ML IV SOLN
7.5000 mL | Freq: Once | INTRAVENOUS | Status: AC | PRN
  Administered 2023-02-01: 7.5 mL via INTRAVENOUS

## 2023-02-01 MED ORDER — ROSUVASTATIN CALCIUM 5 MG PO TABS
5.0000 mg | ORAL_TABLET | ORAL | Status: DC
  Administered 2023-02-01: 5 mg via ORAL
  Filled 2023-02-01: qty 1

## 2023-02-01 MED ORDER — PREGABALIN 25 MG PO CAPS
50.0000 mg | ORAL_CAPSULE | Freq: Two times a day (BID) | ORAL | Status: DC
  Administered 2023-02-01 – 2023-02-03 (×4): 50 mg via ORAL
  Filled 2023-02-01 (×4): qty 2

## 2023-02-01 MED ORDER — AMANTADINE HCL 100 MG PO CAPS
200.0000 mg | ORAL_CAPSULE | Freq: Two times a day (BID) | ORAL | Status: DC
  Administered 2023-02-01 – 2023-02-03 (×4): 200 mg via ORAL
  Filled 2023-02-01 (×4): qty 2

## 2023-02-01 MED ORDER — IRBESARTAN 300 MG PO TABS: 300.0000 mg | ORAL_TABLET | Freq: Every day | ORAL | Status: DC

## 2023-02-01 MED ORDER — INSULIN ASPART 100 UNIT/ML IJ SOLN: 0.0000 [IU] | Freq: Every day | INTRAMUSCULAR | Status: DC

## 2023-02-01 MED ORDER — METOPROLOL SUCCINATE ER 50 MG PO TB24: 50.0000 mg | ORAL_TABLET | Freq: Every day | ORAL | Status: DC

## 2023-02-01 MED ORDER — SODIUM CHLORIDE 0.9% FLUSH
3.0000 mL | Freq: Two times a day (BID) | INTRAVENOUS | Status: DC
  Administered 2023-02-01 – 2023-02-03 (×3): 3 mL via INTRAVENOUS

## 2023-02-01 MED ORDER — ACETAMINOPHEN 650 MG RE SUPP: 650.0000 mg | Freq: Four times a day (QID) | RECTAL | Status: DC | PRN

## 2023-02-01 MED ORDER — HYDRALAZINE HCL 20 MG/ML IJ SOLN: 5.0000 mg | INTRAMUSCULAR | Status: DC | PRN

## 2023-02-01 MED ORDER — ONDANSETRON HCL 4 MG/2ML IJ SOLN: 4.0000 mg | Freq: Four times a day (QID) | INTRAMUSCULAR | Status: DC | PRN

## 2023-02-01 MED ORDER — DULOXETINE HCL 60 MG PO CPEP
60.0000 mg | ORAL_CAPSULE | Freq: Every day | ORAL | Status: DC
  Administered 2023-02-02 – 2023-02-03 (×2): 60 mg via ORAL
  Filled 2023-02-01 (×2): qty 1

## 2023-02-01 MED ORDER — ACETAMINOPHEN 325 MG PO TABS: 650.0000 mg | ORAL_TABLET | Freq: Four times a day (QID) | ORAL | Status: DC | PRN

## 2023-02-01 MED ORDER — BISACODYL 5 MG PO TBEC: 5.0000 mg | DELAYED_RELEASE_TABLET | Freq: Every day | ORAL | Status: DC | PRN

## 2023-02-01 MED ORDER — HYDRALAZINE HCL 25 MG PO TABS
25.0000 mg | ORAL_TABLET | Freq: Three times a day (TID) | ORAL | Status: DC
  Administered 2023-02-01 – 2023-02-03 (×5): 25 mg via ORAL
  Filled 2023-02-01 (×5): qty 1

## 2023-02-01 MED ORDER — ONDANSETRON HCL 4 MG PO TABS: 4.0000 mg | ORAL_TABLET | Freq: Four times a day (QID) | ORAL | Status: DC | PRN

## 2023-02-01 MED ORDER — DOCUSATE SODIUM 100 MG PO CAPS
100.0000 mg | ORAL_CAPSULE | Freq: Two times a day (BID) | ORAL | Status: DC
  Administered 2023-02-02 – 2023-02-03 (×2): 100 mg via ORAL
  Filled 2023-02-01 (×3): qty 1

## 2023-02-01 NOTE — ED Notes (Addendum)
Pt assisted to toilet. Required 2 person assistance to transition from bed to wheelchair and was unable to stand independently due to tremors and leg weakness

## 2023-02-01 NOTE — ED Notes (Signed)
Light green blood tube sent to lab for bmp

## 2023-02-01 NOTE — ED Provider Notes (Signed)
[facility] Provider Note   CSN: [id number] Arrival date & time: 02/01/23  1137     History  Chief Complaint  Patient presents with   Lytle Michaels    Linda Beltran is a 62 y.o. female with primary medical history of MS.  Patient presents to ED for evaluation of fall.  Patient reports that for the last 2 weeks she has had increased falls, she has fallen 8 times.  The patient states that today she feels as if her legs bilaterally are acutely weaker than normal.  Patient states that she is currently being followed by [facility] neurology for her MS.  Patient reports that she was recently diagnosed in the last 1 year.  Patient states last flare was February.  The patient denies any bowel or bladder incontinence, change in sensation near her vagina.  Patient reports that her legs do feel acutely weaker than typical, she also has low back pain that is unchanged from baseline.  Patient denies blood thinners however states that she did hit her head.  Patient denies losing consciousness, reports she was on the ground for maybe 10 minutes.  Patient denies nausea, vomiting, neck pain, photophobia.   Fall Pertinent negatives include no chest pain and no shortness of breath.       Home Medications Prior to Admission medications   Medication Sig Start Date End Date Taking? Authorizing Provider  aspirin EC 81 MG tablet Take 81 mg by mouth daily.    [provider]  Butalbital-APAP-Caffeine 50-300-40 MG CAPS  08/23/17   [provider]  cyclobenzaprine (FLEXERIL) 10 MG tablet  12/24/17   [provider]  dihydroergotamine (MIGRANAL) 4 MG/ML nasal spray Place 1 spray into the nose as needed for migraine. Use in one nostril as directed.  No more than 4 sprays in one hour 08/15/18   Patel, Donika K, DO  Erenumab-aooe (AIMOVIG) 70 MG/ML SOAJ Inject 70 mg into the skin every 30 (thirty) days. 10/01/18   Narda Amber K, DO  Multiple  Vitamins-Calcium (ONE-A-DAY WOMENS PO) Take by mouth.    [provider]  omeprazole (PRILOSEC) 40 MG capsule Take 40 mg by mouth daily.    [provider]  topiramate (TOPAMAX) 100 MG tablet  08/21/17   [provider]  TOPROL XL 50 MG 24 hr tablet  01/31/18   [provider]      Allergies    Amitriptyline, Codeine, Latex, and Neosporin [neomycin-bacitracin zn-polymyx]    Review of Systems   Review of Systems  Respiratory:  Negative for shortness of breath.   Cardiovascular:  Negative for chest pain.  Musculoskeletal:  Positive for back pain.  Neurological:  Positive for weakness.  All other systems reviewed and are negative.   Physical Exam Updated Vital Signs BP 122/65   Pulse 70   Temp 98.3 F (36.8 C) (Oral)   Resp 19   Ht 5\' 2"  (1.575 m)   Wt 72.6 kg   SpO2 100%   BMI 29.26 kg/m  Physical Exam Vitals and nursing note reviewed.  Constitutional:      General: She is not in acute distress.    Appearance: Normal appearance. She is not ill-appearing, toxic-appearing or diaphoretic.  HENT:     Head: Normocephalic and atraumatic.     Nose: Nose normal.     Mouth/Throat:     Mouth: Mucous membranes are moist.     Pharynx: Oropharynx is clear.  Eyes:  Extraocular Movements: Extraocular movements intact.     Pupils: Pupils are equal, round, and reactive to light.  Cardiovascular:     Rate and Rhythm: Normal rate and regular rhythm.  Pulmonary:     Effort: Pulmonary effort is normal.     Breath sounds: Normal breath sounds.  Abdominal:     General: Abdomen is flat.     Tenderness: There is no abdominal tenderness.  Musculoskeletal:     Cervical back: Normal range of motion and neck supple.  Skin:    General: Skin is warm and dry.     Capillary Refill: Capillary refill takes less than 2 seconds.  Neurological:     General: No focal deficit present.     Mental Status: She is alert. Mental status is at baseline.     Cranial  Nerves: Cranial nerves 2-12 are intact. No cranial nerve deficit.     Sensory: Sensation is intact. No sensory deficit.     Motor: Tremor present.     Coordination: Finger-Nose-Finger Test normal. Rapid alternating movements normal.     ED Results / Procedures / Treatments   Labs (all labs ordered are listed, but only abnormal results are displayed) Labs Reviewed  CBC - Abnormal; Notable for the following components:      Result Value   Hemoglobin 11.7 (*)    HCT 35.7 (*)    All other components within normal limits  BASIC METABOLIC PANEL - Abnormal; Notable for the following components:   Glucose, Bld 118 (*)    Creatinine, Ser 1.10 (*)    GFR, Estimated 58 (*)    All other components within normal limits    EKG EKG Interpretation  Date/Time:  Friday February 01 2023 12:14:09 EDT Ventricular Rate:  63 PR Interval:  160 QRS Duration: 81 QT Interval:  395 QTC Calculation: 405 R Axis:   75 Text Interpretation: Sinus rhythm Confirmed by Garnette Gunner [phone]) on 02/01/2023 2:29:48 PM  Radiology CT Head Wo Contrast  Result Date: 02/01/2023 CLINICAL DATA:  Head trauma, moderate to severe. EXAM: CT HEAD WITHOUT CONTRAST TECHNIQUE: Contiguous axial images were obtained from the base of the skull through the vertex without intravenous contrast. RADIATION DOSE REDUCTION: This exam was performed according to the departmental dose-optimization program which includes automated exposure control, adjustment of the mA and/or kV according to patient size and/or use of iterative reconstruction technique. COMPARISON:  03/16/2022 FINDINGS: Brain: No evidence of acute infarction, hemorrhage, hydrocephalus, extra-axial collection or mass lesion/mass effect. Underestimated low-density in the cerebral white matter compared to prior brain MRI. Vascular: No hyperdense vessel or unexpected calcification. Skull: Normal. Negative for fracture or focal lesion. Sinuses/Orbits: No acute finding. IMPRESSION: No  evidence of intracranial injury. Electronically Signed   By: Jorje Guild M.D.   On: 02/01/2023 14:23   MR Lumbar Spine W Wo Contrast  Result Date: 02/01/2023 CLINICAL DATA:  Multiple sclerosis. Increased falls recently with increased lower extremity weakness. Fall this morning EXAM: MRI LUMBAR SPINE WITHOUT AND WITH CONTRAST TECHNIQUE: Multiplanar and multiecho pulse sequences of the lumbar spine were obtained without and with intravenous contrast. CONTRAST:  7.30mL GADAVIST GADOBUTROL 1 MMOL/ML IV SOLN COMPARISON:  12/23/2021 FINDINGS: Segmentation:  Transitional S1 vertebra as previously numbered. Alignment:  Physiologic. Vertebrae:  No fracture, evidence of discitis, or bone lesion. Conus medullaris and cauda equina: Conus extends to the L1-2 level. Conus and cauda equina appear normal. Paraspinal and other soft tissues: Uterine fibroids measuring up to 4.3 cm. Disc levels: L4-L5: Mild  annulus bulging.  No neural compression L5-S1:Disc narrowing and bulging with central protrusion that is stable. Mild bilateral subarticular recess narrowing. Negative facets and patent foramina IMPRESSION: 1. Transitional S1 vertebra as described. 2. Mild lower lumbar spine degeneration without neural compression. No visible inflammatory process including lower cord demyelination. Electronically Signed   By: Jorje Guild M.D.   On: 02/01/2023 13:38    Procedures Procedures   Medications Ordered in ED Medications  gadobutrol (GADAVIST) 1 MMOL/ML injection 7.5 mL (7.5 mLs Intravenous Contrast Given 02/01/23 1332)    ED Course/ Medical Decision Making/ A&P  Medical Decision Making Amount and/or Complexity of Data Reviewed Labs: ordered. Radiology: ordered.  Risk Prescription drug management.   62 year old female presents to ED for evaluation.  Please see HPI for further details.  On examination patient is afebrile and nontachycardic.  Lung sounds clear bilaterally, no hypoxic.  Abdomen soft and  compressible throughout.  Neurological examination at baseline per patient.  Patient does have tremor noted to bilateral lower extremities.  4-5 strength bilateral lower extremities.  Patient perform rapid alternating movements.  No pronator drift.  Patient case discussed with on-call neurologist.  He recommends MRI lumbar spine due to decreased strength bilateral lower extremities.  Will also proceed with CT head to rule out any kind of acute intracranial abnormalities the patient reports that she hit her head when she fell.  CT head unremarkable.  CBC unremarkable without leukocytosis, stable hemoglobin.  BMP with stable creatinine.  MR lumbar spine shows no evidence of inflammatory process, neural compression.  Initially spoke with neurology.  At this time, I requested a formal consult with neurology.  The patient will be signed out to oncoming provider pending neurology consult.  Patient case discussed with attending Dr. Lorin Mercy who has agreed to admit the patient.   Final Clinical Impression(s) / ED Diagnoses Final diagnoses:  Fall, initial encounter  Weakness    Rx / DC Orders ED Discharge Orders     None         Lawana Chambers 02/01/23 1534    Cristie Hem, MD 02/02/23 1324

## 2023-02-01 NOTE — ED Triage Notes (Addendum)
Pt bib EMS coming from home. Reports dizziness and tremors with difficulty walking x 2 weeks. She has been falling frequently. Pt states this morning she fell from standing position and hit back of head. Denies LOC. Not on blood thinners. Pt alert and oriented in triage.  Hx MS. Has an appointment with neurology in a couple weeks.
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
## 2023-02-01 NOTE — ED Notes (Signed)
Pt transported to MRI 

## 2023-02-01 NOTE — ED Notes (Signed)
Pt returned from MRI °

## 2023-02-01 NOTE — ED Notes (Signed)
Lab called to inform that bmp was grossly hemolyzed. Pt out of department and RN unable to redraw lab at this time

## 2023-02-01 NOTE — ED Notes (Signed)
Pt transported to CT ?

## 2023-02-01 NOTE — ED Notes (Addendum)
ED TO INPATIENT HANDOFF REPORT  ED Nurse Name and Phone #: Effie Shy Name/Age/Gender Astrid Divine 62 y.o. female Room/Bed: 029C/029C  Code Status   Code Status: Not on file  Home/SNF/Other Home Patient oriented to: self, place, time, and situation Is this baseline? Yes   Triage Complete: Triage complete  Chief Complaint Generalized weakness [R53.1]  Triage Note Pt bib EMS coming from home. Reports dizziness and tremors with difficulty walking x 2 weeks. She has been falling frequently. Pt states this morning she fell from standing position and hit back of head. Denies LOC. Not on blood thinners. Pt alert and oriented in triage.  Hx MS. Has an appointment with neurology in a couple weeks.    Allergies Allergies  Allergen Reactions   Amitriptyline    Codeine    Latex    Neosporin [Neomycin-Bacitracin Zn-Polymyx]     Level of Care/Admitting Diagnosis ED Disposition     ED Disposition  Admit   Condition  --   Comment  Hospital Area: [facility] [[id number]]  Level of Care: Telemetry Medical [104]  May place patient in observation at [facility] or [facility] if equivalent level of care is available:: No  Covid Evaluation: Asymptomatic - no recent exposure (last 10 days) testing not required  Diagnosis: Generalized weakness [id number]  Admitting Physician: Karmen Bongo [[id number]]  Attending Physician: Karmen Bongo [[id number]]          B Medical/Surgery History Past Medical History:  Diagnosis Date   Diabetes ([place])    Hyperlipidemia    IBS (irritable bowel syndrome)    Insomnia    Migraine    Multiple sclerosis ([place])    Obesity    Seasonal depression ([place])    Past Surgical History:  Procedure Laterality Date   AXILLARY LYMPH NODE BIOPSY     BREAST EXCISIONAL BIOPSY Left    CESAREAN SECTION     HYPERPLASIA TISSUE EXCISION     LAPAROSCOPIC CHOLECYSTECTOMY     NECK SURGERY       A IV Location/Drains/Wounds Patient  Lines/Drains/Airways Status     Active Line/Drains/Airways     Name Placement date Placement time Site Days   Peripheral IV 02/01/23 20 G Left Antecubital 02/01/23  1225  Antecubital  less than 1            Intake/Output Last 24 hours No intake or output data in the 24 hours ending 02/01/23 1514  Labs/Imaging Results for orders placed or performed during the hospital encounter of 02/01/23 (from the past 48 hour(s))  CBC     Status: Abnormal   Collection Time: 02/01/23 12:20 PM  Result Value Ref Range   WBC 9.7 4.0 - 10.5 K/uL   RBC 4.04 3.87 - 5.11 MIL/uL   Hemoglobin 11.7 (L) 12.0 - 15.0 g/dL   HCT 35.7 (L) 36.0 - 46.0 %   MCV 88.4 80.0 - 100.0 fL   MCH 29.0 26.0 - 34.0 pg   MCHC 32.8 30.0 - 36.0 g/dL   RDW 13.5 11.5 - 15.5 %   Platelets 168 150 - 400 K/uL   nRBC 0.0 0.0 - 0.2 %    Comment: Performed at [facility], [address] [place], [place] [id number]  Basic metabolic panel     Status: Abnormal   Collection Time: 02/01/23  1:50 PM  Result Value Ref Range   Sodium 137 135 - 145 mmol/L   Potassium 3.6 3.5 - 5.1 mmol/L   Chloride  100 98 - 111 mmol/L   CO2 24 22 - 32 mmol/L   Glucose, Bld 118 (H) 70 - 99 mg/dL    Comment: Glucose reference range applies only to samples taken after fasting for at least 8 hours.   BUN 14 6 - 20 mg/dL   Creatinine, Ser 1.10 (H) 0.44 - 1.00 mg/dL   Calcium 9.2 8.9 - 10.3 mg/dL   GFR, Estimated 58 (L) >60 mL/min    Comment: (NOTE) Calculated using the CKD-EPI Creatinine Equation (2021)    Anion gap 13 5 - 15    Comment: Performed at [facility] [address] [place], [place] [address]   CT Head Wo Contrast  Result Date: 02/01/2023 CLINICAL DATA:  Head trauma, moderate to severe. EXAM: CT HEAD WITHOUT CONTRAST TECHNIQUE: Contiguous axial images were obtained from the base of the skull through the vertex without intravenous contrast. RADIATION DOSE REDUCTION: This exam was performed according to the departmental  dose-optimization program which includes automated exposure control, adjustment of the mA and/or kV according to patient size and/or use of iterative reconstruction technique. COMPARISON:  03/16/2022 FINDINGS: Brain: No evidence of acute infarction, hemorrhage, hydrocephalus, extra-axial collection or mass lesion/mass effect. Underestimated low-density in the cerebral white matter compared to prior brain MRI. Vascular: No hyperdense vessel or unexpected calcification. Skull: Normal. Negative for fracture or focal lesion. Sinuses/Orbits: No acute finding. IMPRESSION: No evidence of intracranial injury. Electronically Signed   By: Jorje Guild M.D.   On: 02/01/2023 14:23   MR Lumbar Spine W Wo Contrast  Result Date: 02/01/2023 CLINICAL DATA:  Multiple sclerosis. Increased falls recently with increased lower extremity weakness. Fall this morning EXAM: MRI LUMBAR SPINE WITHOUT AND WITH CONTRAST TECHNIQUE: Multiplanar and multiecho pulse sequences of the lumbar spine were obtained without and with intravenous contrast. CONTRAST:  7.51mL GADAVIST GADOBUTROL 1 MMOL/ML IV SOLN COMPARISON:  12/23/2021 FINDINGS: Segmentation:  Transitional S1 vertebra as previously numbered. Alignment:  Physiologic. Vertebrae:  No fracture, evidence of discitis, or bone lesion. Conus medullaris and cauda equina: Conus extends to the L1-2 level. Conus and cauda equina appear normal. Paraspinal and other soft tissues: Uterine fibroids measuring up to 4.3 cm. Disc levels: L4-L5: Mild annulus bulging.  No neural compression L5-S1:Disc narrowing and bulging with central protrusion that is stable. Mild bilateral subarticular recess narrowing. Negative facets and patent foramina IMPRESSION: 1. Transitional S1 vertebra as described. 2. Mild lower lumbar spine degeneration without neural compression. No visible inflammatory process including lower cord demyelination. Electronically Signed   By: Jorje Guild M.D.   On: 02/01/2023 13:38     Pending Labs Unresulted Labs (From admission, onward)    None       Vitals/Pain Today's Vitals   02/01/23 1149 02/01/23 1150 02/01/23 1200 02/01/23 1348  BP:  130/72 130/70 122/65  Pulse:   63 70  Resp:   16 19  Temp:      TempSrc:      SpO2:   100% 100%  Weight: 72.6 kg     Height: 5\' 2"  (1.575 m)     PainSc:        Isolation Precautions No active isolations  Medications Medications  gadobutrol (GADAVIST) 1 MMOL/ML injection 7.5 mL (7.5 mLs Intravenous Contrast Given 02/01/23 1332)    Mobility 2 person assist to transition from stretcher to wheelchair. Unable to support self independently      Focused Assessments    R Recommendations: See Admitting Provider Note  Report given to:  Additional Notes:  IV positional, flushes with arm straight

## 2023-02-02 ENCOUNTER — Observation Stay (HOSPITAL_COMMUNITY): Payer: BC Managed Care – PPO

## 2023-02-02 DIAGNOSIS — E785 Hyperlipidemia, unspecified: Secondary | ICD-10-CM | POA: Diagnosis present

## 2023-02-02 DIAGNOSIS — Z91040 Latex allergy status: Secondary | ICD-10-CM | POA: Diagnosis not present

## 2023-02-02 DIAGNOSIS — Z885 Allergy status to narcotic agent status: Secondary | ICD-10-CM | POA: Diagnosis not present

## 2023-02-02 DIAGNOSIS — Z7982 Long term (current) use of aspirin: Secondary | ICD-10-CM | POA: Diagnosis not present

## 2023-02-02 DIAGNOSIS — Z794 Long term (current) use of insulin: Secondary | ICD-10-CM | POA: Diagnosis not present

## 2023-02-02 DIAGNOSIS — G35 Multiple sclerosis: Secondary | ICD-10-CM | POA: Diagnosis not present

## 2023-02-02 DIAGNOSIS — R531 Weakness: Secondary | ICD-10-CM | POA: Diagnosis not present

## 2023-02-02 DIAGNOSIS — E538 Deficiency of other specified B group vitamins: Secondary | ICD-10-CM | POA: Diagnosis present

## 2023-02-02 DIAGNOSIS — Z888 Allergy status to other drugs, medicaments and biological substances status: Secondary | ICD-10-CM | POA: Diagnosis not present

## 2023-02-02 DIAGNOSIS — I10 Essential (primary) hypertension: Secondary | ICD-10-CM | POA: Diagnosis present

## 2023-02-02 DIAGNOSIS — N179 Acute kidney failure, unspecified: Secondary | ICD-10-CM | POA: Diagnosis present

## 2023-02-02 DIAGNOSIS — Z79899 Other long term (current) drug therapy: Secondary | ICD-10-CM | POA: Diagnosis not present

## 2023-02-02 DIAGNOSIS — G43909 Migraine, unspecified, not intractable, without status migrainosus: Secondary | ICD-10-CM | POA: Diagnosis present

## 2023-02-02 DIAGNOSIS — E119 Type 2 diabetes mellitus without complications: Secondary | ICD-10-CM | POA: Diagnosis present

## 2023-02-02 DIAGNOSIS — E860 Dehydration: Secondary | ICD-10-CM | POA: Diagnosis present

## 2023-02-02 DIAGNOSIS — G8929 Other chronic pain: Secondary | ICD-10-CM | POA: Diagnosis present

## 2023-02-02 LAB — GLUCOSE, CAPILLARY
Glucose-Capillary: 134 mg/dL — ABNORMAL HIGH (ref 70–99)
Glucose-Capillary: 89 mg/dL (ref 70–99)
Glucose-Capillary: 139 mg/dL — ABNORMAL HIGH (ref 70–99)
Glucose-Capillary: 154 mg/dL — ABNORMAL HIGH (ref 70–99)

## 2023-02-02 LAB — HEMOGLOBIN A1C
Hgb A1c MFr Bld: 6.5 % — ABNORMAL HIGH (ref 4.8–5.6)
Mean Plasma Glucose: 140 mg/dL

## 2023-02-02 LAB — TSH: TSH: 1.84 u[IU]/mL (ref 0.350–4.500)

## 2023-02-02 LAB — VITAMIN B12: Vitamin B-12: 115 pg/mL — ABNORMAL LOW (ref 180–914)

## 2023-02-02 MED ORDER — DIROXIMEL FUMARATE 231 MG PO CPDR
462.0000 mg | DELAYED_RELEASE_CAPSULE | Freq: Two times a day (BID) | ORAL | Status: DC
  Administered 2023-02-02 – 2023-02-03 (×3): 462 mg via ORAL
  Filled 2023-02-02 (×5): qty 2

## 2023-02-02 MED ORDER — LACTATED RINGERS IV SOLN: INTRAVENOUS | Status: DC

## 2023-02-02 MED ORDER — DIROXIMEL FUMARATE 231 MG PO CPDR: 462.0000 mg | DELAYED_RELEASE_CAPSULE | Freq: Two times a day (BID) | ORAL | Status: DC

## 2023-02-02 MED ORDER — METOPROLOL SUCCINATE ER 25 MG PO TB24
25.0000 mg | ORAL_TABLET | Freq: Every day | ORAL | Status: DC
  Administered 2023-02-03: 25 mg via ORAL
  Filled 2023-02-02: qty 1

## 2023-02-02 MED ORDER — ENSURE ENLIVE PO LIQD
237.0000 mL | Freq: Three times a day (TID) | ORAL | Status: DC
  Administered 2023-02-02 – 2023-02-03 (×3): 237 mL via ORAL

## 2023-02-02 MED ORDER — GADOBUTROL 1 MMOL/ML IV SOLN
7.5000 mL | Freq: Once | INTRAVENOUS | Status: AC | PRN
  Administered 2023-02-02: 7.5 mL via INTRAVENOUS

## 2023-02-02 MED ORDER — ADULT MULTIVITAMIN W/MINERALS CH
1.0000 | ORAL_TABLET | Freq: Every day | ORAL | Status: DC
  Administered 2023-02-03: 1 via ORAL
  Filled 2023-02-02: qty 1
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
## 2023-02-02 NOTE — Progress Notes (Signed)
Initial Nutrition Assessment  DOCUMENTATION CODES:   Not applicable  INTERVENTION:  - Carb Modified diet.  - Feed patient order to aid patient in eating due to report tremors have hindered ability for patient to feed herself. - Ensure Plus High Protein po TID, each supplement provides 350 kcal and 20 grams of protein. - Multivitamin with minerals daily. - Monitor weight trends.   NUTRITION DIAGNOSIS:   Increased nutrient needs related to chronic illness (MS) as evidenced by estimated needs.  GOAL:   Patient will meet greater than or equal to 90% of their needs  MONITOR:   PO intake, Supplement acceptance, Weight trends  REASON FOR ASSESSMENT:   Consult Assessment of nutrition requirement/status  ASSESSMENT:   62 y.o. female with medical history significant of MS (recently diagnosed in August 2023), DM, HLD who presented with a fall.  Attempted to speak with patient via bedside telephone but no answer. Patient is noted to have tremors and some difficult with speech.    Per EMR, no weight history since 2019 to assess for recent changes. However, weight this admission close to weight back in 2019.  Per H&P, patient reported she has developed tremors in her hand that has resulted in inability to feed herself. Also having speech problems but denied dysphagia. SLP eval is ordered.   Patient currently ordered a Carb Modified diet but no intake yet documented. Patient ordered assistance with feeding already. She will also likely benefit from nutrition supplements to support oral intake, will order.   Medications reviewed and include: Colace, Insulin  Labs reviewed:  Creatinine 1.10 HA1C 6.5   NUTRITION - FOCUSED PHYSICAL EXAM:  RD working remotely  Diet Order:   Diet Order             Diet Carb Modified Fluid consistency: Thin; Room service appropriate? Yes  Diet effective now                   EDUCATION NEEDS:  Not appropriate for education at this  time  Skin:  Skin Assessment: Reviewed RN Assessment  Last BM:  PTA  Height:  Ht Readings from Last 1 Encounters:  02/01/23 5\' 2"  (1.575 m)   Weight:  Wt Readings from Last 1 Encounters:  02/01/23 72.6 kg   Ideal Body Weight:  50 kg  BMI:  Body mass index is 29.26 kg/m.  Estimated Nutritional Needs:  Kcal:  2000-2300 kcals Protein:  85-100 grams Fluid:  >/= 2L    Samson Frederic RD, LDN For contact information, refer to [facility].

## 2023-02-02 NOTE — Evaluation (Signed)
Occupational Therapy Evaluation Patient Details Name: Linda Beltran MRN: [id number] DOB: 04-Jun-1961 Today's Date: 02/02/2023   History of Present Illness 62 y.o. F admitted on 02/01/23 due to falling x4 at home with difficulty ambulating and standing. Pt recently diagnosed with MS and has ongoing mobility issues. PMH significant for DM, HLD, and MS.   Clinical Impression   Pt admitted for concerns listed above. PTA pt reported that she was independent with all ADL's and IADL's, with moderate balance deficits and multiple falls. Pt works from home and her family can provide assist intermittently. At this time she is requiring min assist for all functional mobility and ADL's with RW. During ambulation this session she had an episode of dizziness and reported her legs felt like they were tensing and she was going to fall. Recommending OP OT at this time to address coordination and UE weakness, OT will continue to follow acutely.      Recommendations for follow up therapy are one component of a multi-disciplinary discharge planning process, led by the attending physician.  Recommendations may be updated based on patient status, additional functional criteria and insurance authorization.   Assistance Recommended at Discharge Intermittent Supervision/Assistance  Patient can return home with the following A lot of help with walking and/or transfers;A little help with bathing/dressing/bathroom;Assistance with cooking/housework;Direct supervision/assist for medications management;Help with stairs or ramp for entrance;Assist for transportation    Functional Status Assessment  Patient has had a recent decline in their functional status and demonstrates the ability to make significant improvements in function in a reasonable and predictable amount of time.  Equipment Recommendations  Tub/shower bench;Other (comment) (RW)    Recommendations for Other Services       Precautions / Restrictions  Precautions Precautions: Fall Restrictions Weight Bearing Restrictions: No      Mobility Bed Mobility Overal bed mobility: Modified Independent             General bed mobility comments: Able to come to sitting from elevated HoB    Transfers Overall transfer level: Needs assistance Equipment used: Rolling walker (2 wheels) Transfers: Sit to/from Stand Sit to Stand: Min assist           General transfer comment: Min A to power up to standing, +2 assist with ambulation for safety and chair follow due to pt having some dizziness and legs tensing up like she is going to fall      Balance Overall balance assessment: Needs assistance Sitting-balance support: Bilateral upper extremity supported, Feet supported Sitting balance-Leahy Scale: Fair Sitting balance - Comments: Fair to poor sitting balance when leaning or completing seated task, typically has a posterior lean Postural control: Posterior lean Standing balance support: Bilateral upper extremity supported, Reliant on assistive device for balance Standing balance-Leahy Scale: Poor Standing balance comment: Pt with posterior lean as she fatigues and presents with symptoms                           ADL either performed or assessed with clinical judgement   ADL Overall ADL's : Needs assistance/impaired Eating/Feeding: Minimal assistance;Sitting   Grooming: Minimal assistance;Sitting   Upper Body Bathing: Minimal assistance;Sitting   Lower Body Bathing: Moderate assistance;Sitting/lateral leans;Sit to/from stand   Upper Body Dressing : Minimal assistance;Sitting   Lower Body Dressing: Minimal assistance;Sitting/lateral leans;Sit to/from stand   Toilet Transfer: Minimal assistance;Ambulation   Toileting- Clothing Manipulation and Hygiene: Minimal assistance;Sitting/lateral lean;Sit to/from stand       Functional  mobility during ADLs: Minimal assistance;Rolling walker (2 wheels) General ADL  Comments: Pt requiring min assist for most ADL's due to weakness, decreased coordinatino, and balance deficits. She reports that today is better than yesterday, however continues to require assistance.     Vision Baseline Vision/History: 1 Wears glasses Ability to See in Adequate Light: 0 Adequate Patient Visual Report: No change from baseline Vision Assessment?: No apparent visual deficits     Perception Perception Perception Tested?: No   Praxis Praxis Praxis tested?: Not tested    Pertinent Vitals/Pain Pain Assessment Pain Assessment: No/denies pain     Hand Dominance Right   Extremity/Trunk Assessment Upper Extremity Assessment Upper Extremity Assessment: RUE deficits/detail;LUE deficits/detail RUE Deficits / Details: tremors, weakness, poor coordination RUE Sensation: WNL RUE Coordination: decreased fine motor LUE Deficits / Details: LUE worse than RUE, tremors, decreased coordination, weakness LUE Sensation: decreased light touch LUE Coordination: decreased fine motor;decreased gross motor   Lower Extremity Assessment Lower Extremity Assessment: Defer to PT evaluation   Cervical / Trunk Assessment Cervical / Trunk Assessment: Normal   Communication Communication Communication: No difficulties   Cognition Arousal/Alertness: Awake/alert Behavior During Therapy: WFL for tasks assessed/performed Overall Cognitive Status: Within Functional Limits for tasks assessed                                       General Comments  VSS on RA    Exercises     Shoulder Instructions      Home Living Family/patient expects to be discharged to:: Private residence Living Arrangements: Spouse/significant other Available Help at Discharge: Family;Available PRN/intermittently (husband works during the day, daughter can assist intermittently.) Type of Home: House Home Access: Stairs to enter Technical brewer of Steps: 4 Entrance Stairs-Rails: Left Home  Layout: Multi-level Alternate Level Stairs-Number of Steps: 2 Alternate Level Stairs-Rails: None Bathroom Shower/Tub: Teacher, early years/pre: Handicapped height (comfort height)     Home Equipment: None          Prior Functioning/Environment Prior Level of Function : Needs assist;History of Falls (last six months) (Pt has had 7 falls in 6 months)             Mobility Comments: Since May, has been declining with legs giving out. ADLs Comments: B/D self, stepping into tub difficult, assist with walking sometimes        OT Problem List: Decreased strength;Decreased range of motion;Decreased activity tolerance;Impaired balance (sitting and/or standing);Decreased coordination;Decreased knowledge of use of DME or AE;Impaired sensation;Impaired UE functional use      OT Treatment/Interventions: Self-care/ADL training;Therapeutic exercise;Energy conservation;DME and/or AE instruction;Therapeutic activities;Patient/family education;Balance training    OT Goals(Current goals can be found in the care plan section) Acute Rehab OT Goals Patient Stated Goal: To get her balance back to baseline OT Goal Formulation: With patient/family Time For Goal Achievement: 02/16/23 Potential to Achieve Goals: Good ADL Goals Pt Will Perform Grooming: with modified independence;with adaptive equipment;standing Pt Will Perform Lower Body Bathing: with supervision;sitting/lateral leans;sit to/from stand;with adaptive equipment Pt Will Transfer to Toilet: with modified independence;ambulating Pt Will Perform Toileting - Clothing Manipulation and hygiene: with modified independence;sitting/lateral leans;sit to/from stand Pt/caregiver will Perform Home Exercise Program: Increased ROM;Increased strength;Both right and left upper extremity;With theraband;With theraputty;Independently;With written HEP provided  OT Frequency: Min 2X/week    Co-evaluation              AM-PAC OT "6 Clicks"  Daily Activity     Outcome Measure Help from another person eating meals?: A Little Help from another person taking care of personal grooming?: A Little Help from another person toileting, which includes using toliet, bedpan, or urinal?: A Little Help from another person bathing (including washing, rinsing, drying)?: A Lot Help from another person to put on and taking off regular upper body clothing?: A Little Help from another person to put on and taking off regular lower body clothing?: A Little 6 Click Score: 17   End of Session Equipment Utilized During Treatment: Gait belt;Rolling walker (2 wheels) Nurse Communication: Mobility status  Activity Tolerance: Patient tolerated treatment well Patient left: in chair;with call bell/phone within reach;with nursing/sitter in room;with family/visitor present  OT Visit Diagnosis: Unsteadiness on feet (R26.81);Other abnormalities of gait and mobility (R26.89);Muscle weakness (generalized) (M62.81)                Time: 1001-1037 OT Time Calculation (min): 36 min Charges:  OT General Charges $OT Visit: 1 Visit OT Evaluation $OT Eval Moderate Complexity: [place], OTR/L [place] Acute Rehab  Giancarlo Askren Elane Yolanda Bonine 02/02/2023, 11:20 AM

## 2023-02-02 NOTE — Evaluation (Signed)
Physical Therapy Evaluation Patient Details Name: Linda Beltran MRN: [id number] DOB: 1960/12/16 Today's Date: 02/02/2023  History of Present Illness  62 y.o. F admitted on 02/01/23 due to falling x4 at home with difficulty ambulating and standing. Pt recently diagnosed with MS and has ongoing mobility issues. PMH significant for DM, HLD, and MS.  Clinical Impression  Pt admitted with above diagnosis. Pt was able to ambulate with RW however needed +_2 for safety as pt became dizzy and bil LE weakness and needed a chair to rest in.  Pt with poor postural coordination causing her to need RW and assist.  Daughter states she can provide assist at home as needed. REcommend RW for home use and 24 hour care.  Pt currently with functional limitations due to the deficits listed below (see PT Problem List). Pt will benefit from acute skilled PT to increase their independence and safety with mobility to allow discharge.          Recommendations for follow up therapy are one component of a multi-disciplinary discharge planning process, led by the attending physician.  Recommendations may be updated based on patient status, additional functional criteria and insurance authorization.  Follow Up Recommendations       Assistance Recommended at Discharge Frequent or constant Supervision/Assistance  Patient can return home with the following  A little help with walking and/or transfers;A little help with bathing/dressing/bathroom;Assistance with cooking/housework;Assistance with feeding;Assist for transportation;Help with stairs or ramp for entrance    Equipment Recommendations Rolling walker (2 wheels) (issued gait belt)  Recommendations for Other Services       Functional Status Assessment Patient has had a recent decline in their functional status and demonstrates the ability to make significant improvements in function in a reasonable and predictable amount of time.     Precautions / Restrictions  Precautions Precautions: Fall Restrictions Weight Bearing Restrictions: No      Mobility  Bed Mobility Overal bed mobility: Modified Independent             General bed mobility comments: Able to come to sitting from elevated HoB    Transfers Overall transfer level: Needs assistance Equipment used: Rolling walker (2 wheels) Transfers: Sit to/from Stand Sit to Stand: Min assist           General transfer comment: Min A to power up to standing, +2 assist with ambulation for safety and chair follow due to pt having some dizziness and legs tensing up like she is going to fall.  Pt with significant posterior lean at times.    Ambulation/Gait Ambulation/Gait assistance: Min assist, +2 safety/equipment Gait Distance (Feet): 100 Feet Assistive device: Rolling walker (2 wheels) Gait Pattern/deviations: Step-through pattern, Decreased stride length, Leaning posteriorly   Gait velocity interpretation: <1.31 ft/sec, indicative of household ambulator   General Gait Details: Pt relies on RW for support for balance.  When pt was in hallway, stated ""I am dizzy, my legs are tensing up and I feel like I am going to fall".  Brought chair to pt and checked BP which was slightly elevated but not soft.  Pt was able to continue to walk back to room with a rest break.  Stairs            Wheelchair Mobility    Modified Rankin (Stroke Patients Only)       Balance Overall balance assessment: Needs assistance Sitting-balance support: Bilateral upper extremity supported, Feet supported Sitting balance-Leahy Scale: Fair Sitting balance - Comments: Fair to poor sitting  balance when leaning or completing seated task, typically has a posterior lean Postural control: Posterior lean Standing balance support: Bilateral upper extremity supported, Reliant on assistive device for balance Standing balance-Leahy Scale: Poor Standing balance comment: Pt with posterior lean as she fatigues and  presents with symptoms, relies on RW for support as well as external support                             Pertinent Vitals/Pain Pain Assessment Pain Assessment: No/denies pain    Home Living Family/patient expects to be discharged to:: Private residence Living Arrangements: Spouse/significant other Available Help at Discharge: Family;Available PRN/intermittently (husband works during the day, daughter can assist intermittently.) Type of Home: House Home Access: Stairs to enter Entrance Stairs-Rails: Left Entrance Stairs-Number of Steps: 4 Alternate Level Stairs-Number of Steps: 2 Home Layout: Multi-level Home Equipment: None      Prior Function Prior Level of Function : Needs assist;History of Falls (last six months) (Pt has had 7 falls in 6 months)             Mobility Comments: Since May, has been declining with legs giving out. ADLs Comments: B/D self, stepping into tub difficult, assist with walking sometimes     Hand Dominance   Dominant Hand: Right    Extremity/Trunk Assessment   Upper Extremity Assessment Upper Extremity Assessment: Defer to OT evaluation RUE Deficits / Details: tremors, weakness, poor coordination RUE Sensation: WNL RUE Coordination: decreased fine motor LUE Deficits / Details: LUE worse than RUE, tremors, decreased coordination, weakness LUE Sensation: decreased light touch LUE Coordination: decreased fine motor;decreased gross motor    Lower Extremity Assessment Lower Extremity Assessment: RLE deficits/detail;LLE deficits/detail RLE Deficits / Details: grossly 3/5 RLE Coordination: decreased fine motor;decreased gross motor LLE Deficits / Details: grossly 3-/5 LLE Coordination: decreased fine motor;decreased gross motor    Cervical / Trunk Assessment Cervical / Trunk Assessment: Normal  Communication   Communication: No difficulties  Cognition Arousal/Alertness: Awake/alert Behavior During Therapy: WFL for tasks  assessed/performed Overall Cognitive Status: Within Functional Limits for tasks assessed                                          General Comments General comments (skin integrity, edema, etc.): VSS on RA    Exercises General Exercises - Lower Extremity Ankle Circles/Pumps: AROM, Both, 10 reps, Seated Hip Flexion/Marching: AROM, Both, 10 reps, Seated   Assessment/Plan    PT Assessment Patient needs continued PT services  PT Problem List Decreased balance;Decreased activity tolerance;Decreased mobility;Decreased knowledge of use of DME;Decreased safety awareness;Decreased knowledge of precautions       PT Treatment Interventions DME instruction;Gait training;Functional mobility training;Therapeutic activities;Therapeutic exercise;Balance training;Patient/family education;Stair training    PT Goals (Current goals can be found in the Care Plan section)  Acute Rehab PT Goals Patient Stated Goal: to go home PT Goal Formulation: With patient/family Time For Goal Achievement: 02/15/23 Potential to Achieve Goals: Good    Frequency Min 4X/week     Co-evaluation PT/OT/SLP Co-Evaluation/Treatment: Yes Reason for Co-Treatment: For patient/therapist safety PT goals addressed during session: Mobility/safety with mobility         AM-PAC PT "6 Clicks" Mobility  Outcome Measure Help needed turning from your back to your side while in a flat bed without using bedrails?: A Little Help needed moving from lying on your  back to sitting on the side of a flat bed without using bedrails?: A Little Help needed moving to and from a bed to a chair (including a wheelchair)?: A Little Help needed standing up from a chair using your arms (e.g., wheelchair or bedside chair)?: A Little Help needed to walk in hospital room?: Total Help needed climbing 3-5 steps with a railing? : Total 6 Click Score: 14    End of Session Equipment Utilized During Treatment: Gait belt Activity  Tolerance: Patient limited by fatigue Patient left: in chair;with call bell/phone within reach;with chair alarm set;with family/visitor present Nurse Communication: Mobility status PT Visit Diagnosis: Unsteadiness on feet (R26.81);Muscle weakness (generalized) (M62.81)    Time: 1001-1037 PT Time Calculation (min) (ACUTE ONLY): 36 min   Charges:   PT Evaluation $PT Eval Moderate Complexity: 1 Mod          Mekai Wilkinson M,PT Acute Rehab Services [phone]   Alvira Philips 02/02/2023, 2:49 PM
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
## 2023-02-02 NOTE — Progress Notes (Signed)
PROGRESS NOTE                                                                                                                                                                                                             Patient Demographics:    Linda Beltran, is a 62 y.o. female, DOB - 23-Sep-1961, [id number]  Outpatient Primary MD for the patient is Saintclair Halsted, FNP    LOS - 0  Admit date - 02/01/2023    Chief Complaint  Patient presents with   Fall       Brief Narrative (HPI from H&P)    62 y.o. female with medical history significant of DM, HLD, and MS presenting with a fall.   She reports that she fell 4 times this AM.  She has been having difficulty standing/walking.  She was diagnosed with MS in August and has had mobility issues.  The last few weeks she has developed tremors in her arms and legs, sometimes difficulty with speech, at times diplopia.  Presented to the ER and was admitted for further workup.   Subjective:    Linda Beltran today has, No headache, No chest pain, No abdominal pain - No Nausea, no shortness of breath or cough however diffuse generalized weakness.  Unable to stand up by herself or bear weight in her legs.   Assessment  & Plan :    HX of MS recently diagnosed under care of [facility] neurology with now new symptoms of generalized tremors, difficulty in initiating speech and at times diplopia with difficulty in standing up and bearing weight. She has undergone MRI of the brain, C, T and L-spine which does confirm MS but no signs of acute flare, symptoms are somewhat puzzling, will try to rule out UTI, obtain UA, she does appear to be somewhat dehydrated with mild AKI, hold ARB and diuretic hydrate with IV fluids, check TSH, B12 and A1c.  Neurology to evaluate.  She is working with PT OT but had hard time getting up or even ambulating a few steps by herself.  Continue to monitor.  History of MS.   For now home medications continued.  Chronic pain.  Continue combination of duloxetine, pregabalin, tizanidine.  Dyslipidemia.  On statin.  Hypotension with AKI.  Blood pressure currently soft, dehydrated with mild AKI.  IV fluids, cut home  dose beta-blocker in half, hold ARB and HCTZ.    AKI.  See above.    DM type II.  On sliding scale.  Monitor and adjust.      Condition - Fair  Family Communication  :  daughter bedside  Code Status :  Full  Consults  :  Neuro   PUD Prophylaxis :    Procedures  :     RI brain, C, T, L-spine.  Consistent with history of multiple sclerosis, reviewed by neurologist Dr. Alferd Patee, no MS flare.      Disposition Plan  :    Status is: Observation  DVT Prophylaxis  :    enoxaparin (LOVENOX) injection 40 mg Start: 02/01/23 2200   Lab Results  Component Value Date   PLT 168 02/01/2023    Diet :  Diet Order             Diet Carb Modified Fluid consistency: Thin; Room service appropriate? Yes  Diet effective now                    Inpatient Medications  Scheduled Meds:  amantadine  200 mg Oral BID   Diroximel Fumarate  462 mg Oral BID   docusate sodium  100 mg Oral BID   DULoxetine  60 mg Oral Daily   enoxaparin (LOVENOX) injection  40 mg Subcutaneous Q24H   hydrALAZINE  25 mg Oral TID   insulin aspart  0-15 Units Subcutaneous TID WC   insulin aspart  0-5 Units Subcutaneous QHS   [START ON 02/03/2023] metoprolol succinate  25 mg Oral Daily   pregabalin  50 mg Oral BID   rosuvastatin  5 mg Oral QODAY   sodium chloride flush  3 mL Intravenous Q12H   Continuous Infusions:  lactated ringers     PRN Meds:.acetaminophen **OR** acetaminophen, bisacodyl, hydrALAZINE, ondansetron **OR** ondansetron (ZOFRAN) IV, oxyCODONE, polyethylene glycol, tiZANidine  Antibiotics  :    Anti-infectives (From admission, onward)    None         Objective:   Vitals:   02/01/23 1900 02/02/23 0000 02/02/23 0235 02/02/23 0743  BP: (!)  140/62 117/60 112/64 125/71  Pulse: 72     Resp: 17 15 17    Temp: 98.6 F (37 C) 98.5 F (36.9 C) 98 F (36.7 C) 98.2 F (36.8 C)  TempSrc: Oral Oral Oral Oral  SpO2: 98% 99% 97%   Weight:      Height:        Wt Readings from Last 3 Encounters:  02/01/23 72.6 kg  08/15/18 71.3 kg  02/10/18 61.8 kg     Intake/Output Summary (Last 24 hours) at 02/02/2023 1028 Last data filed at 02/01/2023 2132 Gross per 24 hour  Intake 3 ml  Output --  Net 3 ml     Physical Exam  Awake Alert, No new F.N deficits, diffused generalized weakness [name].AT,PERRAL Supple Neck, No JVD,   Symmetrical Chest wall movement, Good air movement bilaterally, CTAB RRR,No Gallops,Rubs or new Murmurs,  +ve B.Sounds, Abd Soft, No tenderness,   No Cyanosis, Clubbing or edema       Data Review:    Recent Labs  Lab 02/01/23 1220  WBC 9.7  HGB 11.7*  HCT 35.7*  PLT 168  MCV 88.4  MCH 29.0  MCHC 32.8  RDW 13.5    Recent Labs  Lab 02/01/23 1350 02/01/23 1740  NA 137  --   K 3.6  --   CL 100  --  CO2 24  --   ANIONGAP 13  --   GLUCOSE 118*  --   BUN 14  --   CREATININE 1.10*  --   HGBA1C  --  6.5*  CALCIUM 9.2  --     Radiology Reports MR CERVICAL SPINE W WO CONTRAST  Result Date: 02/02/2023 CLINICAL DATA:  Multiple sclerosis. EXAM: MRI CERVICAL AND THORACIC SPINE WITHOUT AND WITH CONTRAST TECHNIQUE: Multiplanar and multiecho pulse sequences of the cervical spine, to include the craniocervical junction and cervicothoracic junction, and the thoracic spine, were obtained without and with intravenous contrast. CONTRAST:  7.23mL GADAVIST GADOBUTROL 1 MMOL/ML IV SOLN COMPARISON:  01/27/2022 FINDINGS: MRI CERVICAL SPINE FINDINGS Alignment: Physiologic. Vertebrae: C5 through C7 ACDF with solid arthrodesis. Cord: Short segment T2 hyperintensities in the left posterior cord at C2 and ventral/right cord at C3. On axial gradient imaging a small left cord plaque could be present at C4. no cord  enhancement or swelling. Posterior Fossa, vertebral arteries, paraspinal tissues: Negative for perispinal mass or inflammation. Disc levels: C2-3: Unremarkable. C3-4: Uncovertebral spurring on both sides. C4-5: Disc narrowing with endplate and uncovertebral ridging. Ligamentum flavum thickening. Degenerative spinal stenosis with mild cord flattening. Biforaminal impingement 0000000: Uncomplicated ACDF. [id number]: Uncomplicated ACDF [id number] significant degenerative change. MRI THORACIC SPINE FINDINGS Alignment:  Physiologic. Vertebrae: No fracture, evidence of discitis, or bone lesion. Cord: T2 hyperintensity with slight volume loss in the posterior aspect of the cord at T2-3 and T3. No enhancing or progressive signal abnormality. Paraspinal and other soft tissues: Negative Disc levels: Mild midthoracic endplate spurring.  No herniation or impingement IMPRESSION: Cervical spine: 1. Nonenhancing demyelinating plaque scattered in the upper cervical spine are stable from March 2023. 2. C4-5 adjacent segment degeneration with biforaminal impingement and mild cord indentation. 3. Uncomplicated [id number] ACDF. Thoracic spine: Stable nonenhancing plaque in the T2/T3 cord. No new or progressive disease. Electronically Signed   By: Jorje Guild M.D.   On: 02/02/2023 04:49   MR THORACIC SPINE W WO CONTRAST  Result Date: 02/02/2023 CLINICAL DATA:  Multiple sclerosis. EXAM: MRI CERVICAL AND THORACIC SPINE WITHOUT AND WITH CONTRAST TECHNIQUE: Multiplanar and multiecho pulse sequences of the cervical spine, to include the craniocervical junction and cervicothoracic junction, and the thoracic spine, were obtained without and with intravenous contrast. CONTRAST:  7.72mL GADAVIST GADOBUTROL 1 MMOL/ML IV SOLN COMPARISON:  01/27/2022 FINDINGS: MRI CERVICAL SPINE FINDINGS Alignment: Physiologic. Vertebrae: C5 through C7 ACDF with solid arthrodesis. Cord: Short segment T2 hyperintensities in the left posterior cord at C2 and ventral/right  cord at C3. On axial gradient imaging a small left cord plaque could be present at C4. no cord enhancement or swelling. Posterior Fossa, vertebral arteries, paraspinal tissues: Negative for perispinal mass or inflammation. Disc levels: C2-3: Unremarkable. C3-4: Uncovertebral spurring on both sides. C4-5: Disc narrowing with endplate and uncovertebral ridging. Ligamentum flavum thickening. Degenerative spinal stenosis with mild cord flattening. Biforaminal impingement 0000000: Uncomplicated ACDF. [id number]: Uncomplicated ACDF [id number] significant degenerative change. MRI THORACIC SPINE FINDINGS Alignment:  Physiologic. Vertebrae: No fracture, evidence of discitis, or bone lesion. Cord: T2 hyperintensity with slight volume loss in the posterior aspect of the cord at T2-3 and T3. No enhancing or progressive signal abnormality. Paraspinal and other soft tissues: Negative Disc levels: Mild midthoracic endplate spurring.  No herniation or impingement IMPRESSION: Cervical spine: 1. Nonenhancing demyelinating plaque scattered in the upper cervical spine are stable from March 2023. 2. C4-5 adjacent segment degeneration with biforaminal impingement and mild cord indentation. 3. Uncomplicated Q000111Q ACDF. Thoracic spine:  Stable nonenhancing plaque in the T2/T3 cord. No new or progressive disease. Electronically Signed   By: Jorje Guild M.D.   On: 02/02/2023 04:49   MR BRAIN W WO CONTRAST  Result Date: 02/02/2023 CLINICAL DATA:  Multiple sclerosis EXAM: MRI HEAD WITHOUT AND WITH CONTRAST TECHNIQUE: Multiplanar, multiecho pulse sequences of the brain and surrounding structures were obtained without and with intravenous contrast. CONTRAST:  7.63mL GADAVIST GADOBUTROL 1 MMOL/ML IV SOLN COMPARISON:  03/16/2022 FINDINGS: Brain: Small FLAIR hyperintensities seen in the periventricular, juxtacortical, and infratentorial regions (infratentorial being in the right ventral pons and left posterior pons). No new lesion is seen when allowing  for differences in slice thickness and selection. No infarct, hemorrhage, hydrocephalus, collection, or masslike finding. Vascular: Major flow voids and vascular enhancements are preserved Skull and upper cervical spine: Normal marrow signal Sinuses/Orbits: No significant finding IMPRESSION: Multiple sclerosis pattern with stable extent when compared to 03/16/2022. No enhancing disease. Electronically Signed   By: Jorje Guild M.D.   On: 02/02/2023 04:27   CT Head Wo Contrast  Result Date: 02/01/2023 CLINICAL DATA:  Head trauma, moderate to severe. EXAM: CT HEAD WITHOUT CONTRAST TECHNIQUE: Contiguous axial images were obtained from the base of the skull through the vertex without intravenous contrast. RADIATION DOSE REDUCTION: This exam was performed according to the departmental dose-optimization program which includes automated exposure control, adjustment of the mA and/or kV according to patient size and/or use of iterative reconstruction technique. COMPARISON:  03/16/2022 FINDINGS: Brain: No evidence of acute infarction, hemorrhage, hydrocephalus, extra-axial collection or mass lesion/mass effect. Underestimated low-density in the cerebral white matter compared to prior brain MRI. Vascular: No hyperdense vessel or unexpected calcification. Skull: Normal. Negative for fracture or focal lesion. Sinuses/Orbits: No acute finding. IMPRESSION: No evidence of intracranial injury. Electronically Signed   By: Jorje Guild M.D.   On: 02/01/2023 14:23   MR Lumbar Spine W Wo Contrast  Result Date: 02/01/2023 CLINICAL DATA:  Multiple sclerosis. Increased falls recently with increased lower extremity weakness. Fall this morning EXAM: MRI LUMBAR SPINE WITHOUT AND WITH CONTRAST TECHNIQUE: Multiplanar and multiecho pulse sequences of the lumbar spine were obtained without and with intravenous contrast. CONTRAST:  7.68mL GADAVIST GADOBUTROL 1 MMOL/ML IV SOLN COMPARISON:  12/23/2021 FINDINGS: Segmentation:   Transitional S1 vertebra as previously numbered. Alignment:  Physiologic. Vertebrae:  No fracture, evidence of discitis, or bone lesion. Conus medullaris and cauda equina: Conus extends to the L1-2 level. Conus and cauda equina appear normal. Paraspinal and other soft tissues: Uterine fibroids measuring up to 4.3 cm. Disc levels: L4-L5: Mild annulus bulging.  No neural compression L5-S1:Disc narrowing and bulging with central protrusion that is stable. Mild bilateral subarticular recess narrowing. Negative facets and patent foramina IMPRESSION: 1. Transitional S1 vertebra as described. 2. Mild lower lumbar spine degeneration without neural compression. No visible inflammatory process including lower cord demyelination. Electronically Signed   By: Jorje Guild M.D.   On: 02/01/2023 13:38      Signature  -   Lala Lund M.D on 02/02/2023 at 10:28 AM   -  To page go to [email or web address]

## 2023-02-03 DIAGNOSIS — R531 Weakness: Secondary | ICD-10-CM | POA: Diagnosis not present

## 2023-02-03 LAB — URINALYSIS, W/ REFLEX TO CULTURE (INFECTION SUSPECTED)
Bilirubin Urine: NEGATIVE
Glucose, UA: NEGATIVE mg/dL
Hgb urine dipstick: NEGATIVE
Ketones, ur: 20 mg/dL — AB
Leukocytes,Ua: NEGATIVE
Nitrite: NEGATIVE
Protein, ur: NEGATIVE mg/dL
Specific Gravity, Urine: 1.01 (ref 1.005–1.030)
pH: 5 (ref 5.0–8.0)

## 2023-02-03 LAB — CBC WITH DIFFERENTIAL/PLATELET
Abs Immature Granulocytes: 0.02 10*3/uL (ref 0.00–0.07)
Basophils Absolute: 0.1 10*3/uL (ref 0.0–0.1)
Basophils Relative: 1 %
Eosinophils Absolute: 0.1 10*3/uL (ref 0.0–0.5)
Eosinophils Relative: 1 %
HCT: 31 % — ABNORMAL LOW (ref 36.0–46.0)
Hemoglobin: 10.3 g/dL — ABNORMAL LOW (ref 12.0–15.0)
Immature Granulocytes: 0 %
Lymphocytes Relative: 53 %
Lymphs Abs: 3.4 10*3/uL (ref 0.7–4.0)
MCH: 29.2 pg (ref 26.0–34.0)
MCHC: 33.2 g/dL (ref 30.0–36.0)
MCV: 87.8 fL (ref 80.0–100.0)
Monocytes Absolute: 0.8 10*3/uL (ref 0.1–1.0)
Monocytes Relative: 11 %
Neutro Abs: 2.3 10*3/uL (ref 1.7–7.7)
Neutrophils Relative %: 34 %
Platelets: 189 10*3/uL (ref 150–400)
RBC: 3.53 MIL/uL — ABNORMAL LOW (ref 3.87–5.11)
RDW: 13.4 % (ref 11.5–15.5)
WBC: 6.6 10*3/uL (ref 4.0–10.5)
nRBC: 0 % (ref 0.0–0.2)

## 2023-02-03 LAB — BASIC METABOLIC PANEL
Anion gap: 11 (ref 5–15)
BUN: 14 mg/dL (ref 6–20)
CO2: 22 mmol/L (ref 22–32)
Calcium: 8.7 mg/dL — ABNORMAL LOW (ref 8.9–10.3)
Chloride: 105 mmol/L (ref 98–111)
Creatinine, Ser: 1.03 mg/dL — ABNORMAL HIGH (ref 0.44–1.00)
GFR, Estimated: >60 mL/min (ref >60)
Glucose, Bld: 123 mg/dL — ABNORMAL HIGH (ref 70–99)
Potassium: 3.5 mmol/L (ref 3.5–5.1)
Sodium: 138 mmol/L (ref 135–145)

## 2023-02-03 LAB — GLUCOSE, CAPILLARY: Glucose-Capillary: 115 mg/dL — ABNORMAL HIGH (ref 70–99)

## 2023-02-03 LAB — MAGNESIUM: Magnesium: 1.9 mg/dL (ref 1.7–2.4)

## 2023-02-03 MED ORDER — "INSULIN SYRINGE-NEEDLE U-100 25G X 1"" 1 ML MISC": 0 refills | Status: AC

## 2023-02-03 MED ORDER — CYANOCOBALAMIN 1000 MCG/ML IJ SOLN
1000.0000 ug | Freq: Every day | INTRAMUSCULAR | Status: DC
  Administered 2023-02-03: 1000 ug via SUBCUTANEOUS
  Filled 2023-02-03: qty 1

## 2023-02-03 MED ORDER — AMLODIPINE BESYLATE 5 MG PO TABS: 5.0000 mg | ORAL_TABLET | Freq: Every day | ORAL | 0 refills | Status: DC

## 2023-02-03 MED ORDER — CYANOCOBALAMIN 1000 MCG/ML IJ SOLN: 1000.0000 ug | INTRAMUSCULAR | 0 refills | Status: DC

## 2023-02-03 MED ORDER — POTASSIUM CHLORIDE CRYS ER 20 MEQ PO TBCR
40.0000 meq | EXTENDED_RELEASE_TABLET | Freq: Once | ORAL | Status: AC
  Administered 2023-02-03: 40 meq via ORAL
  Filled 2023-02-03: qty 2

## 2023-02-03 NOTE — Discharge Instructions (Addendum)
Follow with Primary MD Saintclair Halsted, FNP in 7 days, follow-up with your neurosurgeon within a week and the neurologist as requested  Get CBC, CMP, magnesium, [id number], folic acid-  checked next visit with your primary MD    Activity: As tolerated with Full fall precautions use walker/cane & assistance as needed  Disposition Home   Diet: Heart Healthy Low Carb, check CBGs q. ACH S.  Special Instructions: If you have smoked or chewed Tobacco  in the last 2 yrs please stop smoking, stop any regular Alcohol  and or any Recreational drug use.  On your next visit with your primary care physician please Get Medicines reviewed and adjusted.  Please request your Prim.MD to go over all Hospital Tests and Procedure/Radiological results at the follow up, please get all Hospital records sent to your Prim MD by signing hospital release before you go home.  If you experience worsening of your admission symptoms, develop shortness of breath, life threatening emergency, suicidal or homicidal thoughts you must seek medical attention immediately by calling 911 or calling your MD immediately  if symptoms less severe.  You Must read complete instructions/literature along with all the possible adverse reactions/side effects for all the Medicines you take and that have been prescribed to you. Take any new Medicines after you have completely understood and accpet all the possible adverse reactions/side effects.

## 2023-02-03 NOTE — Progress Notes (Signed)
AVS reviewed with pt and pt's daughter. Home medication retrieved from pharmacy and given to pt. All questions answered by primary RN, with pt verbalizing understanding of discharge instructions. Pt left via private vehicle with her daughter in NAD.

## 2023-02-03 NOTE — Progress Notes (Addendum)
Physical Therapy Treatment Patient Details Name: Linda Beltran MRN: [id number] DOB: 10/21/1961 Today's Date: 02/03/2023   History of Present Illness 62 y.o. F admitted on 02/01/23 due to falling x4 at home with difficulty ambulating and standing. Pt recently diagnosed with MS and has ongoing mobility issues. PMH significant for DM, HLD, and MS.    PT Comments    Pt admitted with above diagnosis. Pt progressed distance with gait still needing min assist at times for safety due to posterior lean. Pts sister in law and daughter will provide 24 hour care for pt.  Gave pt a HEP for UE and LEs.  Issued yellow theraband to pt for UE exercises. Education complete for pt and she obtained RW for home use. Plan is to d/c today.  Pt currently with functional limitations due to balance and endurance deficits. Pt will benefit from acute skilled PT to increase their independence and safety with mobility to allow discharge.      Recommendations for follow up therapy are one component of a multi-disciplinary discharge planning process, led by the attending physician.  Recommendations may be updated based on patient status, additional functional criteria and insurance authorization.  Follow Up Recommendations       Assistance Recommended at Discharge Frequent or constant Supervision/Assistance  Patient can return home with the following A little help with walking and/or transfers;A little help with bathing/dressing/bathroom;Assistance with cooking/housework;Assistance with feeding;Assist for transportation;Help with stairs or ramp for entrance   Equipment Recommendations  Rolling walker (2 wheels)    Recommendations for Other Services       Precautions / Restrictions Precautions Precautions: Fall Restrictions Weight Bearing Restrictions: No     Mobility  Bed Mobility               General bed mobility comments: in chair on arrival    Transfers Overall transfer level: Needs  assistance Equipment used: Rolling walker (2 wheels) Transfers: Sit to/from Stand Sit to Stand: Min assist           General transfer comment: Min A to power up to standing, +2 assist with ambulation for safety and chair follow.  Pt with incr unsteadiness as she fatigues with tremors and  significant posterior lean at times.    Ambulation/Gait Ambulation/Gait assistance: Min assist, +2 safety/equipment Gait Distance (Feet): 400 Feet (250 ffet and then 150 feet) Assistive device: Rolling walker (2 wheels) Gait Pattern/deviations: Step-through pattern, Decreased stride length, Leaning posteriorly   Gait velocity interpretation: <1.31 ft/sec, indicative of household ambulator   General Gait Details: Pt relies on RW for support for balance.  Pt with  chair follow.  Pt was able to continue to walk back to room with a rest break.   Stairs             Wheelchair Mobility    Modified Rankin (Stroke Patients Only)       Balance         Postural control: Posterior lean Standing balance support: Bilateral upper extremity supported, Reliant on assistive device for balance Standing balance-Leahy Scale: Poor Standing balance comment: Pt with posterior lean as she fatigues and presents with symptoms, relies on RW for support as well as external support                            Cognition Arousal/Alertness: Awake/alert Behavior During Therapy: WFL for tasks assessed/performed Overall Cognitive Status: Within Functional Limits for tasks assessed  Exercises Other Exercises Other Exercises: Access Code: [email or web address]  URL: [email or web address]  Date: 02/03/2023  Prepared by: Arrie Aran    Exercises  - Supine Shoulder Flexion with Anchored Resistance  - 3 x daily - 7 x weekly - 2 sets - 10 reps - 5 hold  - Reclined Elbow Flexion with Anchored Resistance  - 3 x daily - 7 x weekly - 2 sets - 10 reps - 5 hold   - Thumb Opposition  - 3 x daily - 7 x weekly - 2 sets - 10 reps - 5 hold  - Individual Finger Extensions  - 3 x daily - 7 x weekly - 2 sets - 10 reps - 5 hold  - Supine Straight Leg Raises  - 3 x daily - 7 x weekly - 2 sets - 10 reps - 5 hold  - Hip Flexion  - 3 x daily - 7 x weekly - 2 sets - 10 reps - 5 hold  - Supine Bridge  - 3 x daily - 7 x weekly - 2 sets - 10 reps - 5 hold  - Supine Ankle Pumps  - 3 x daily - 7 x weekly - 2 sets - 10 reps - 5 hold  - Supine Quad Set  - 3 x daily - 7 x weekly - 2 sets - 10 reps - 5 hold    General Comments General comments (skin integrity, edema, etc.): VSS on RA      Pertinent Vitals/Pain Pain Assessment Pain Assessment: No/denies pain    Home Living                          Prior Function            PT Goals (current goals can now be found in the care plan section) Acute Rehab PT Goals Patient Stated Goal: to go home Progress towards PT goals: Progressing toward goals    Frequency    Min 4X/week      PT Plan Current plan remains appropriate    Co-evaluation              AM-PAC PT "6 Clicks" Mobility   Outcome Measure  Help needed turning from your back to your side while in a flat bed without using bedrails?: A Little Help needed moving from lying on your back to sitting on the side of a flat bed without using bedrails?: A Little Help needed moving to and from a bed to a chair (including a wheelchair)?: A Little Help needed standing up from a chair using your arms (e.g., wheelchair or bedside chair)?: A Little Help needed to walk in hospital room?: A Lot Help needed climbing 3-5 steps with a railing? : Total 6 Click Score: 15    End of Session Equipment Utilized During Treatment: Gait belt Activity Tolerance: Patient limited by fatigue Patient left: in chair;with call bell/phone within reach;with chair alarm set Nurse Communication: Mobility status PT Visit Diagnosis: Unsteadiness on feet (R26.81);Muscle  weakness (generalized) (M62.81)     Time: [id number] PT Time Calculation (min) (ACUTE ONLY): 16 min  Charges:  $Gait Training: 8-22 mins                     Lougenia Morrissey M,PT Acute Rehab Services [place] 02/03/2023, 11:53 AM

## 2023-02-03 NOTE — TOC Transition Note (Addendum)
Transition of Care [facility]) - CM/SW Discharge Note   Patient Details  Name: Linda Beltran MRN: [id number] Date of Birth: 05-Jun-1961  Transition of Care [facility]) CM/SW Contact:  Carles Collet, RN Phone Number: 02/03/2023, 10:09 AM   Clinical Narrative:     Spoke with patient, discussed recommendations for discharge. She is agreeable to [facility] services if able to set up and outpatient if not. Will make referral to office of choice [facility] as a back up plan if [facility] is unable to accept. Ref [id number]  RW to be delivered to room through [place] prior to DC.   Final next level of care: Home w Home Health Services Barriers to Discharge: No Barriers Identified   Patient Goals and CMS Choice CMS Medicare.gov Compare Post Acute Care list provided to:: Patient Choice offered to / list presented to : Patient  Discharge Placement                         Discharge Plan and Services Additional resources added to the After Visit Summary for                  DME Arranged: Walker rolling DME Agency: [employer] Date DME Agency Contacted: 02/03/23 Time DME Agency Contacted: 1006 Representative spoke with at DME Agency: [place]: PT [place]: [place] Date [place]: 02/03/23 Time [place]: 1006 Representative spoke with at [place]: [place] Determinants of Health ([place]) Interventions SDOH Screenings   Food Insecurity: No Food Insecurity (02/01/2023)  Housing: [place]  (02/01/2023)  Transportation Needs: No Transportation Needs (02/01/2023)  Utilities: Not At Risk (02/01/2023)  Tobacco Use: Low Risk  (02/01/2023)     Readmission Risk Interventions     No data to display

## 2023-02-03 NOTE — Plan of Care (Signed)
                                      [facility]                            [address] [place], [place] [address]      Linda Beltran was admitted to the Hospital on 02/01/2023 and Discharged  02/03/2023 and should be excused from work/school   for 7  days starting from date -  02/01/2023 , may return to work/school without any restrictions.  Call Lala Lund MD, [facility]  [phone] with questions.  Lala Lund M.D on 02/03/2023,at 11:42 AM  [facility]   Office  [phone]

## 2023-02-03 NOTE — Discharge Summary (Signed)
Linda Beltran [id number] DOB: 1961-01-02 DOA: 02/01/2023  PCP: Saintclair Halsted, FNP  Admit date: 02/01/2023  Discharge date: 02/03/2023  Admitted From: Home   Disposition:  Home  Recommendations for Outpatient Follow-up:   Follow up with PCP in 1-2 weeks  PCP Please obtain BMP/CBC, 2 view CXR in 1week,  (see Discharge instructions)   PCP Please follow up on the following pending results: Monitor B12, TSH, CBC, BMP closely.  Needs close outpatient follow-up with neurology and neurosurgery.   Home Health: PT   Equipment/Devices: as below  Consultations: Neurology Discharge Condition: Stable    CODE STATUS: Full    Diet Recommendation: Heart Healthy Low Carb    Chief Complaint  Patient presents with   Fall     Brief history of present illness from the day of admission and additional interim summary    62 y.o. female with medical history significant of DM, HLD, and MS presenting with a fall.   She reports that she fell 4 times this AM.  She has been having difficulty standing/walking.  She was diagnosed with MS in August and has had mobility issues.  The last few weeks she has developed tremors in her arms and legs, sometimes difficulty with speech, at times diplopia.  Presented to the ER and was admitted for further workup.                                                                  Hospital Course    HX of MS recently diagnosed under care of [facility] neurology with now new symptoms of generalized tremors, difficulty in initiating speech and at times diplopia with difficulty in standing up and bearing weight likely brought on by dehydration, AKI and newly diagnosed B12 deficiency. She has undergone MRI of the brain, C, T and L-spine which does confirm MS but no signs of acute flare, was seen by neurology  team, no further neurological workup was recommended.  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  In the hospital she was hydrated with IV fluids, offending blood pressure medications and diuretics were discontinued, she was placed on B12 replacement.  Symptoms are much improved she is eager to go home will be discharged home with home PT, walker, B12 supplementation with outpatient follow-up with PCP, neurology and neurosurgery.  Will request patient's PCP to monitor B12 levels closely and make sure patient follows with neurologist and neurosurgery.    History of MS.  For now home medications continued.   Chronic pain.  Continue combination of duloxetine, pregabalin, tizanidine.   Dyslipidemia.  On statin.   Hypotension with AKI.  Diuretic and  ACE inhibitor stopped, hydrated, AKI resolved blood pressure is improved, PCP to monitor.   Hypertension.  Blood pressure improving.  Home blood pressure medications have been adjusted PCP to monitor and adjust.    AKI.  See above.     DM type II.  Continue home regimen  Discharge diagnosis     Principal Problem:   Generalized weakness Active Problems:   Multiple sclerosis (HCC)   Hyperlipidemia   Diabetes (HCC)   Chronic pain   Essential hypertension    Discharge instructions    Discharge Instructions     Discharge instructions   Complete by: As directed    Follow with Primary MD Saintclair Halsted, FNP in 7 days, follow-up with your neurosurgeon within a week and the neurologist as requested  Get CBC, CMP, magnesium, [id number], folic acid-  checked next visit with your primary MD    Activity: As tolerated with Full fall precautions use walker/cane & assistance as needed  Disposition Home   Diet: Heart Healthy Low Carb, check CBGs q. ACH S.  Special Instructions: If you  have smoked or chewed Tobacco  in the last 2 yrs please stop smoking, stop any regular Alcohol  and or any Recreational drug use.  On your next visit with your primary care physician please Get Medicines reviewed and adjusted.  Please request your Prim.MD to go over all Hospital Tests and Procedure/Radiological results at the follow up, please get all Hospital records sent to your Prim MD by signing hospital release before you go home.  If you experience worsening of your admission symptoms, develop shortness of breath, life threatening emergency, suicidal or homicidal thoughts you must seek medical attention immediately by calling 911 or calling your MD immediately  if symptoms less severe.  You Must read complete instructions/literature along with all the possible adverse reactions/side effects for all the Medicines you take and that have been prescribed to you. Take any new Medicines after you have completely understood and accpet all the possible adverse reactions/side effects.       Discharge Medications   Allergies as of 02/03/2023       Reactions   Amitriptyline Other (See Comments)   Unknown reaction   Codeine Nausea And Vomiting   Latex Itching, Rash   Neosporin [neomycin-bacitracin Zn-polymyx] Rash        Medication List     STOP taking these medications    valsartan-hydrochlorothiazide 320-25 MG tablet Commonly known as: DIOVAN-HCT       TAKE these medications    amantadine 100 MG capsule Commonly known as: SYMMETREL Take 200 mg by mouth 2 (two) times daily.   amLODipine 5 MG tablet Commonly known as: NORVASC Take 1 tablet (5 mg total) by mouth daily.   cyanocobalamin 1000 MCG/ML injection Commonly known as: VITAMIN B12 Inject 1 mL (1,000 mcg total) into the skin every 30 (thirty) days.   DULoxetine 60 MG capsule Commonly known as: CYMBALTA Take 60 mg by mouth daily.   hydrALAZINE 25 MG tablet Commonly known as: APRESOLINE Take 25 mg by mouth 3  (three) times daily.   Insulin Syringe-Needle U-100 25G X 1" 1 ML Misc 1 cc insulin syringe and needles for B12 subcu shots once a month dispense 10 needles and syringes   metFORMIN 500 MG 24 hr tablet Commonly known as: GLUCOPHAGE-XR Take 500 mg by mouth in the morning and at bedtime.   metoprolol succinate 50 MG 24 hr tablet Commonly known as: TOPROL-XL Take 50 mg by  mouth daily.   Nurtec 75 MG Tbdp Generic drug: Rimegepant Sulfate Take 75 mg by mouth daily as needed (migraine).   pregabalin 50 MG capsule Commonly known as: LYRICA Take 50 mg by mouth 2 (two) times daily.   rosuvastatin 5 MG tablet Commonly known as: CRESTOR Take 5 mg by mouth every other day.   tiZANidine 2 MG tablet Commonly known as: ZANAFLEX Take 2 mg by mouth every 8 (eight) hours as needed for muscle spasms.   Vumerity 231 MG Cpdr Generic drug: Diroximel Fumarate Take 462 mg by mouth 2 (two) times daily.               Durable Medical Equipment  (From admission, onward)           Start     Ordered   02/03/23 0947  For home use only DME Walker rolling  Once       Comments: 5 wheel  Question Answer Comment  Walker: With 5 Inch Wheels   Patient needs a walker to treat with the following condition Weakness      02/03/23 0946             Follow-up Information     Saintclair Halsted, FNP. Schedule an appointment as soon as possible for a visit in 1 week(s).   Specialty: Family Medicine Contact information: [place] [phone]         Dawley, Theodoro Doing, DO. Schedule an appointment as soon as possible for a visit in 1 week(s).   Contact information: [address] [place] [address] [phone]         [facility]. Schedule an appointment as soon as possible for a visit in 1 week(s).   Contact information: [address]     [place] [phone]                 Major procedures and Radiology Reports - PLEASE review detailed and final reports thoroughly  -       MR CERVICAL SPINE W WO CONTRAST  Result Date: 02/02/2023 CLINICAL DATA:  Multiple sclerosis. EXAM: MRI CERVICAL AND THORACIC SPINE WITHOUT AND WITH CONTRAST TECHNIQUE: Multiplanar and multiecho pulse sequences of the cervical spine, to include the craniocervical junction and cervicothoracic junction, and the thoracic spine, were obtained without and with intravenous contrast. CONTRAST:  7.28mL GADAVIST GADOBUTROL 1 MMOL/ML IV SOLN COMPARISON:  01/27/2022 FINDINGS: MRI CERVICAL SPINE FINDINGS Alignment: Physiologic. Vertebrae: C5 through C7 ACDF with solid arthrodesis. Cord: Short segment T2 hyperintensities in the left posterior cord at C2 and ventral/right cord at C3. On axial gradient imaging a small left cord plaque could be present at C4. no cord enhancement or swelling. Posterior Fossa, vertebral arteries, paraspinal tissues: Negative for perispinal mass or inflammation. Disc levels: C2-3: Unremarkable. C3-4: Uncovertebral spurring on both sides. C4-5: Disc narrowing with endplate and uncovertebral ridging. Ligamentum flavum thickening. Degenerative spinal stenosis with mild cord flattening. Biforaminal impingement 0000000: Uncomplicated ACDF. [id number]: Uncomplicated ACDF [id number] significant degenerative change. MRI THORACIC SPINE FINDINGS Alignment:  Physiologic. Vertebrae: No fracture, evidence of discitis, or bone lesion. Cord: T2 hyperintensity with slight volume loss in the posterior aspect of the cord at T2-3 and T3. No enhancing or progressive signal abnormality. Paraspinal and other soft tissues: Negative Disc levels: Mild midthoracic endplate spurring.  No herniation or impingement IMPRESSION: Cervical spine: 1. Nonenhancing demyelinating plaque scattered in the upper cervical spine are stable  from March 2023. 2. C4-5 adjacent segment degeneration with biforaminal impingement and mild cord  indentation. 3. Uncomplicated [id number] ACDF. Thoracic spine: Stable nonenhancing plaque in the T2/T3 cord. No new or progressive disease. Electronically Signed   By: Jorje Guild M.D.   On: 02/02/2023 04:49   MR THORACIC SPINE W WO CONTRAST  Result Date: 02/02/2023 CLINICAL DATA:  Multiple sclerosis. EXAM: MRI CERVICAL AND THORACIC SPINE WITHOUT AND WITH CONTRAST TECHNIQUE: Multiplanar and multiecho pulse sequences of the cervical spine, to include the craniocervical junction and cervicothoracic junction, and the thoracic spine, were obtained without and with intravenous contrast. CONTRAST:  7.71mL GADAVIST GADOBUTROL 1 MMOL/ML IV SOLN COMPARISON:  01/27/2022 FINDINGS: MRI CERVICAL SPINE FINDINGS Alignment: Physiologic. Vertebrae: C5 through C7 ACDF with solid arthrodesis. Cord: Short segment T2 hyperintensities in the left posterior cord at C2 and ventral/right cord at C3. On axial gradient imaging a small left cord plaque could be present at C4. no cord enhancement or swelling. Posterior Fossa, vertebral arteries, paraspinal tissues: Negative for perispinal mass or inflammation. Disc levels: C2-3: Unremarkable. C3-4: Uncovertebral spurring on both sides. C4-5: Disc narrowing with endplate and uncovertebral ridging. Ligamentum flavum thickening. Degenerative spinal stenosis with mild cord flattening. Biforaminal impingement 0000000: Uncomplicated ACDF. [id number]: Uncomplicated ACDF [id number] significant degenerative change. MRI THORACIC SPINE FINDINGS Alignment:  Physiologic. Vertebrae: No fracture, evidence of discitis, or bone lesion. Cord: T2 hyperintensity with slight volume loss in the posterior aspect of the cord at T2-3 and T3. No enhancing or progressive signal abnormality. Paraspinal and other soft tissues: Negative Disc levels: Mild midthoracic endplate spurring.  No herniation or impingement IMPRESSION: Cervical spine: 1. Nonenhancing demyelinating plaque scattered in the upper cervical spine are stable from  March 2023. 2. C4-5 adjacent segment degeneration with biforaminal impingement and mild cord indentation. 3. Uncomplicated [id number] ACDF. Thoracic spine: Stable nonenhancing plaque in the T2/T3 cord. No new or progressive disease. Electronically Signed   By: Jorje Guild M.D.   On: 02/02/2023 04:49   MR BRAIN W WO CONTRAST  Result Date: 02/02/2023 CLINICAL DATA:  Multiple sclerosis EXAM: MRI HEAD WITHOUT AND WITH CONTRAST TECHNIQUE: Multiplanar, multiecho pulse sequences of the brain and surrounding structures were obtained without and with intravenous contrast. CONTRAST:  7.9mL GADAVIST GADOBUTROL 1 MMOL/ML IV SOLN COMPARISON:  03/16/2022 FINDINGS: Brain: Small FLAIR hyperintensities seen in the periventricular, juxtacortical, and infratentorial regions (infratentorial being in the right ventral pons and left posterior pons). No new lesion is seen when allowing for differences in slice thickness and selection. No infarct, hemorrhage, hydrocephalus, collection, or masslike finding. Vascular: Major flow voids and vascular enhancements are preserved Skull and upper cervical spine: Normal marrow signal Sinuses/Orbits: No significant finding IMPRESSION: Multiple sclerosis pattern with stable extent when compared to 03/16/2022. No enhancing disease. Electronically Signed   By: Jorje Guild M.D.   On: 02/02/2023 04:27   CT Head Wo Contrast  Result Date: 02/01/2023 CLINICAL DATA:  Head trauma, moderate to severe. EXAM: CT HEAD WITHOUT CONTRAST TECHNIQUE: Contiguous axial images were obtained from the base of the skull through the vertex without intravenous contrast. RADIATION DOSE REDUCTION: This exam was performed according to the departmental dose-optimization program which includes automated exposure control, adjustment of the mA and/or kV according to patient size and/or use of iterative reconstruction technique. COMPARISON:  03/16/2022 FINDINGS: Brain: No evidence of acute infarction, hemorrhage,  hydrocephalus, extra-axial collection or mass lesion/mass effect. Underestimated low-density in the cerebral white matter compared to prior brain MRI. Vascular: No hyperdense vessel or unexpected calcification.  Skull: Normal. Negative for fracture or focal lesion. Sinuses/Orbits: No acute finding. IMPRESSION: No evidence of intracranial injury. Electronically Signed   By: Jorje Guild M.D.   On: 02/01/2023 14:23   MR Lumbar Spine W Wo Contrast  Result Date: 02/01/2023 CLINICAL DATA:  Multiple sclerosis. Increased falls recently with increased lower extremity weakness. Fall this morning EXAM: MRI LUMBAR SPINE WITHOUT AND WITH CONTRAST TECHNIQUE: Multiplanar and multiecho pulse sequences of the lumbar spine were obtained without and with intravenous contrast. CONTRAST:  7.53mL GADAVIST GADOBUTROL 1 MMOL/ML IV SOLN COMPARISON:  12/23/2021 FINDINGS: Segmentation:  Transitional S1 vertebra as previously numbered. Alignment:  Physiologic. Vertebrae:  No fracture, evidence of discitis, or bone lesion. Conus medullaris and cauda equina: Conus extends to the L1-2 level. Conus and cauda equina appear normal. Paraspinal and other soft tissues: Uterine fibroids measuring up to 4.3 cm. Disc levels: L4-L5: Mild annulus bulging.  No neural compression L5-S1:Disc narrowing and bulging with central protrusion that is stable. Mild bilateral subarticular recess narrowing. Negative facets and patent foramina IMPRESSION: 1. Transitional S1 vertebra as described. 2. Mild lower lumbar spine degeneration without neural compression. No visible inflammatory process including lower cord demyelination. Electronically Signed   By: Jorje Guild M.D.   On: 02/01/2023 13:38    Micro Results    No results found for this or any previous visit (from the past 240 hour(s)).  Today   Subjective    Linda Beltran today has no headache,no chest abdominal pain,no new weakness tingling or numbness, feels much better wants to go home  today.    Objective   Blood pressure 134/70, pulse 84, temperature 98.1 F (36.7 C), temperature source Oral, resp. rate 12, height 5\' 2"  (1.575 m), weight 72.6 kg, SpO2 99 %.   Intake/Output Summary (Last 24 hours) at 02/03/2023 0947 Last data filed at 02/03/2023 0000 Gross per 24 hour  Intake 243 ml  Output 250 ml  Net -7 ml    Exam  Awake Alert, No new F.N deficits,    Alexander.AT,PERRAL Supple Neck,   Symmetrical Chest wall movement, Good air movement bilaterally, CTAB RRR,No Gallops,   +ve B.Sounds, Abd Soft, Non tender,  No Cyanosis, Clubbing or edema    Data Review   Recent Labs  Lab 02/01/23 1220 02/03/23 0724  WBC 9.7 6.6  HGB 11.7* 10.3*  HCT 35.7* 31.0*  PLT 168 189  MCV 88.4 87.8  MCH 29.0 29.2  MCHC 32.8 33.2  RDW 13.5 13.4  LYMPHSABS  --  3.4  MONOABS  --  0.8  EOSABS  --  0.1  BASOSABS  --  0.1    Recent Labs  Lab 02/01/23 1350 02/01/23 1740 02/02/23 1328 02/03/23 0724  NA 137  --   --  138  K 3.6  --   --  3.5  CL 100  --   --  105  CO2 24  --   --  22  ANIONGAP 13  --   --  11  GLUCOSE 118*  --   --  123*  BUN 14  --   --  14  CREATININE 1.10*  --   --  1.03*  TSH  --   --  1.840  --   HGBA1C  --  6.5*  --   --   MG  --   --   --  1.9  CALCIUM 9.2  --   --  8.7*    Total Time in preparing paper work, data  evaluation and todays exam - 35 minutes  Signature  -    Lala Lund M.D on 02/03/2023 at 9:47 AM   -  To page go to [email or web address]

## 2023-02-03 NOTE — Plan of Care (Signed)

## 2023-02-04 LAB — NEUROMYELITIS OPTICA AUTOAB, IGG: NMO-IgG: <1.5 U/mL (ref 0.0–3.0)

## 2023-02-04 LAB — HEMOGLOBIN A1C
Hgb A1c MFr Bld: 6.5 % — ABNORMAL HIGH (ref 4.8–5.6)
Mean Plasma Glucose: 140 mg/dL

## 2023-02-07 DIAGNOSIS — I10 Essential (primary) hypertension: Secondary | ICD-10-CM | POA: Diagnosis not present

## 2023-02-07 DIAGNOSIS — R600 Localized edema: Secondary | ICD-10-CM | POA: Diagnosis not present

## 2023-02-07 DIAGNOSIS — E538 Deficiency of other specified B group vitamins: Secondary | ICD-10-CM | POA: Diagnosis not present

## 2023-02-07 DIAGNOSIS — N289 Disorder of kidney and ureter, unspecified: Secondary | ICD-10-CM | POA: Diagnosis not present

## 2023-02-08 ENCOUNTER — Ambulatory Visit: Payer: BC Managed Care – PPO | Attending: Internal Medicine

## 2023-02-08 DIAGNOSIS — R2689 Other abnormalities of gait and mobility: Secondary | ICD-10-CM | POA: Diagnosis not present

## 2023-02-08 DIAGNOSIS — M6281 Muscle weakness (generalized): Secondary | ICD-10-CM | POA: Insufficient documentation

## 2023-02-08 DIAGNOSIS — R278 Other lack of coordination: Secondary | ICD-10-CM | POA: Diagnosis not present

## 2023-02-08 DIAGNOSIS — R262 Difficulty in walking, not elsewhere classified: Secondary | ICD-10-CM | POA: Insufficient documentation
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
## 2023-02-11 ENCOUNTER — Ambulatory Visit: Payer: BC Managed Care – PPO | Admitting: Physical Therapy

## 2023-02-11 ENCOUNTER — Encounter: Payer: Self-pay | Admitting: Physical Therapy

## 2023-02-11 VITALS — BP 122/83 | HR 63

## 2023-02-11 DIAGNOSIS — M6281 Muscle weakness (generalized): Secondary | ICD-10-CM | POA: Diagnosis not present

## 2023-02-11 DIAGNOSIS — R262 Difficulty in walking, not elsewhere classified: Secondary | ICD-10-CM

## 2023-02-11 DIAGNOSIS — R2689 Other abnormalities of gait and mobility: Secondary | ICD-10-CM | POA: Diagnosis not present

## 2023-02-11 DIAGNOSIS — R278 Other lack of coordination: Secondary | ICD-10-CM
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
## 2023-02-13 ENCOUNTER — Ambulatory Visit: Payer: BC Managed Care – PPO

## 2023-02-13 DIAGNOSIS — R2689 Other abnormalities of gait and mobility: Secondary | ICD-10-CM | POA: Diagnosis not present

## 2023-02-13 DIAGNOSIS — R262 Difficulty in walking, not elsewhere classified: Secondary | ICD-10-CM | POA: Diagnosis not present

## 2023-02-13 DIAGNOSIS — R278 Other lack of coordination: Secondary | ICD-10-CM | POA: Diagnosis not present

## 2023-02-13 DIAGNOSIS — M6281 Muscle weakness (generalized): Secondary | ICD-10-CM
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
## 2023-02-14 DIAGNOSIS — G35 Multiple sclerosis: Secondary | ICD-10-CM | POA: Diagnosis not present

## 2023-02-14 DIAGNOSIS — R768 Other specified abnormal immunological findings in serum: Secondary | ICD-10-CM | POA: Diagnosis not present

## 2023-02-14 DIAGNOSIS — M62838 Other muscle spasm: Secondary | ICD-10-CM | POA: Diagnosis not present

## 2023-02-14 DIAGNOSIS — R2689 Other abnormalities of gait and mobility: Secondary | ICD-10-CM | POA: Diagnosis not present

## 2023-02-14 DIAGNOSIS — R41841 Cognitive communication deficit: Secondary | ICD-10-CM | POA: Diagnosis not present

## 2023-02-14 DIAGNOSIS — R499 Unspecified voice and resonance disorder: Secondary | ICD-10-CM | POA: Diagnosis not present

## 2023-02-14 DIAGNOSIS — R531 Weakness: Secondary | ICD-10-CM | POA: Diagnosis not present

## 2023-02-19 ENCOUNTER — Encounter: Payer: Self-pay | Admitting: Physical Therapy

## 2023-02-19 ENCOUNTER — Ambulatory Visit: Payer: BC Managed Care – PPO | Admitting: Physical Therapy

## 2023-02-19 VITALS — BP 148/74

## 2023-02-19 DIAGNOSIS — R278 Other lack of coordination: Secondary | ICD-10-CM

## 2023-02-19 DIAGNOSIS — M6281 Muscle weakness (generalized): Secondary | ICD-10-CM

## 2023-02-19 DIAGNOSIS — R2689 Other abnormalities of gait and mobility: Secondary | ICD-10-CM

## 2023-02-19 DIAGNOSIS — R262 Difficulty in walking, not elsewhere classified: Secondary | ICD-10-CM
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
## 2023-02-21 ENCOUNTER — Ambulatory Visit: Payer: BC Managed Care – PPO

## 2023-02-21 DIAGNOSIS — R2689 Other abnormalities of gait and mobility: Secondary | ICD-10-CM

## 2023-02-21 DIAGNOSIS — R262 Difficulty in walking, not elsewhere classified: Secondary | ICD-10-CM

## 2023-02-21 DIAGNOSIS — R278 Other lack of coordination: Secondary | ICD-10-CM | POA: Diagnosis not present

## 2023-02-21 DIAGNOSIS — M6281 Muscle weakness (generalized): Secondary | ICD-10-CM | POA: Diagnosis not present
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
## 2023-02-26 ENCOUNTER — Ambulatory Visit: Payer: BC Managed Care – PPO

## 2023-02-26 DIAGNOSIS — R262 Difficulty in walking, not elsewhere classified: Secondary | ICD-10-CM

## 2023-02-26 DIAGNOSIS — R278 Other lack of coordination: Secondary | ICD-10-CM

## 2023-02-26 DIAGNOSIS — M6281 Muscle weakness (generalized): Secondary | ICD-10-CM | POA: Diagnosis not present

## 2023-02-26 DIAGNOSIS — R2689 Other abnormalities of gait and mobility: Secondary | ICD-10-CM | POA: Diagnosis not present
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
## 2023-02-28 ENCOUNTER — Ambulatory Visit: Payer: BC Managed Care – PPO | Admitting: Occupational Therapy

## 2023-03-01 ENCOUNTER — Ambulatory Visit: Payer: BC Managed Care – PPO

## 2023-03-04 ENCOUNTER — Ambulatory Visit: Payer: BC Managed Care – PPO

## 2023-03-04 DIAGNOSIS — E538 Deficiency of other specified B group vitamins: Secondary | ICD-10-CM | POA: Diagnosis not present

## 2023-03-04 DIAGNOSIS — I10 Essential (primary) hypertension: Secondary | ICD-10-CM | POA: Diagnosis not present

## 2023-03-06 ENCOUNTER — Ambulatory Visit: Payer: BC Managed Care – PPO

## 2023-04-15 DIAGNOSIS — H47293 Other optic atrophy, bilateral: Secondary | ICD-10-CM | POA: Diagnosis not present

## 2023-04-15 DIAGNOSIS — G35 Multiple sclerosis: Secondary | ICD-10-CM | POA: Diagnosis not present

## 2023-04-24 DIAGNOSIS — E559 Vitamin D deficiency, unspecified: Secondary | ICD-10-CM | POA: Diagnosis not present

## 2023-04-24 DIAGNOSIS — G35 Multiple sclerosis: Secondary | ICD-10-CM | POA: Diagnosis not present

## 2023-05-07 DIAGNOSIS — E538 Deficiency of other specified B group vitamins: Secondary | ICD-10-CM | POA: Diagnosis not present

## 2023-05-10 DIAGNOSIS — G35 Multiple sclerosis: Secondary | ICD-10-CM | POA: Diagnosis not present

## 2023-05-10 DIAGNOSIS — E559 Vitamin D deficiency, unspecified: Secondary | ICD-10-CM | POA: Diagnosis not present

## 2023-05-10 DIAGNOSIS — Z111 Encounter for screening for respiratory tuberculosis: Secondary | ICD-10-CM | POA: Diagnosis not present

## 2023-05-22 DIAGNOSIS — I10 Essential (primary) hypertension: Secondary | ICD-10-CM | POA: Diagnosis not present

## 2023-05-22 DIAGNOSIS — E785 Hyperlipidemia, unspecified: Secondary | ICD-10-CM | POA: Diagnosis not present

## 2023-05-22 DIAGNOSIS — M542 Cervicalgia: Secondary | ICD-10-CM | POA: Diagnosis not present

## 2023-05-22 DIAGNOSIS — M5416 Radiculopathy, lumbar region: Secondary | ICD-10-CM | POA: Diagnosis not present

## 2023-05-22 DIAGNOSIS — M50121 Cervical disc disorder at C4-C5 level with radiculopathy: Secondary | ICD-10-CM | POA: Diagnosis not present

## 2023-05-22 DIAGNOSIS — Z885 Allergy status to narcotic agent status: Secondary | ICD-10-CM | POA: Diagnosis not present

## 2023-05-22 DIAGNOSIS — M5412 Radiculopathy, cervical region: Secondary | ICD-10-CM | POA: Diagnosis not present

## 2023-05-22 DIAGNOSIS — E119 Type 2 diabetes mellitus without complications: Secondary | ICD-10-CM | POA: Diagnosis not present

## 2023-05-22 DIAGNOSIS — M5010 Cervical disc disorder with radiculopathy, unspecified cervical region: Secondary | ICD-10-CM | POA: Diagnosis not present

## 2023-05-22 DIAGNOSIS — G35 Multiple sclerosis: Secondary | ICD-10-CM | POA: Diagnosis not present

## 2023-05-22 DIAGNOSIS — Z7984 Long term (current) use of oral hypoglycemic drugs: Secondary | ICD-10-CM | POA: Diagnosis not present

## 2023-05-22 DIAGNOSIS — M4802 Spinal stenosis, cervical region: Secondary | ICD-10-CM | POA: Diagnosis not present

## 2023-05-29 DIAGNOSIS — M542 Cervicalgia: Secondary | ICD-10-CM | POA: Diagnosis not present

## 2023-05-29 DIAGNOSIS — M5412 Radiculopathy, cervical region: Secondary | ICD-10-CM | POA: Diagnosis not present

## 2023-06-01 DIAGNOSIS — M5416 Radiculopathy, lumbar region: Secondary | ICD-10-CM | POA: Diagnosis not present

## 2023-06-08 DIAGNOSIS — M5416 Radiculopathy, lumbar region: Secondary | ICD-10-CM | POA: Diagnosis not present

## 2023-06-14 DIAGNOSIS — G35 Multiple sclerosis: Secondary | ICD-10-CM | POA: Diagnosis not present

## 2023-06-22 DIAGNOSIS — M5416 Radiculopathy, lumbar region: Secondary | ICD-10-CM | POA: Diagnosis not present

## 2023-06-29 DIAGNOSIS — M5416 Radiculopathy, lumbar region: Secondary | ICD-10-CM | POA: Diagnosis not present

## 2023-07-02 DIAGNOSIS — M4802 Spinal stenosis, cervical region: Secondary | ICD-10-CM | POA: Diagnosis not present

## 2023-07-02 DIAGNOSIS — M7918 Myalgia, other site: Secondary | ICD-10-CM | POA: Diagnosis not present

## 2023-07-02 DIAGNOSIS — Z9889 Other specified postprocedural states: Secondary | ICD-10-CM | POA: Diagnosis not present

## 2023-07-02 DIAGNOSIS — M542 Cervicalgia: Secondary | ICD-10-CM | POA: Diagnosis not present

## 2023-07-10 DIAGNOSIS — M25562 Pain in left knee: Secondary | ICD-10-CM | POA: Diagnosis not present

## 2023-07-10 DIAGNOSIS — R768 Other specified abnormal immunological findings in serum: Secondary | ICD-10-CM | POA: Diagnosis not present

## 2023-07-10 DIAGNOSIS — M8580 Other specified disorders of bone density and structure, unspecified site: Secondary | ICD-10-CM | POA: Diagnosis not present

## 2023-07-10 DIAGNOSIS — M25561 Pain in right knee: Secondary | ICD-10-CM | POA: Diagnosis not present

## 2023-07-10 DIAGNOSIS — G8929 Other chronic pain: Secondary | ICD-10-CM | POA: Diagnosis not present

## 2023-07-13 DIAGNOSIS — M5416 Radiculopathy, lumbar region: Secondary | ICD-10-CM | POA: Diagnosis not present

## 2023-07-22 DIAGNOSIS — G35 Multiple sclerosis: Secondary | ICD-10-CM | POA: Diagnosis not present

## 2023-07-30 DIAGNOSIS — G35 Multiple sclerosis: Secondary | ICD-10-CM | POA: Diagnosis not present

## 2023-08-07 DIAGNOSIS — E785 Hyperlipidemia, unspecified: Secondary | ICD-10-CM | POA: Diagnosis not present

## 2023-08-07 DIAGNOSIS — E1165 Type 2 diabetes mellitus with hyperglycemia: Secondary | ICD-10-CM | POA: Diagnosis not present

## 2023-08-07 DIAGNOSIS — I10 Essential (primary) hypertension: Secondary | ICD-10-CM | POA: Diagnosis not present

## 2023-08-07 DIAGNOSIS — E559 Vitamin D deficiency, unspecified: Secondary | ICD-10-CM | POA: Diagnosis not present

## 2023-08-14 DIAGNOSIS — G35 Multiple sclerosis: Secondary | ICD-10-CM | POA: Diagnosis not present

## 2023-08-23 ENCOUNTER — Other Ambulatory Visit: Payer: Self-pay | Admitting: Family Medicine

## 2023-08-23 DIAGNOSIS — Z0000 Encounter for general adult medical examination without abnormal findings: Secondary | ICD-10-CM

## 2023-08-28 DIAGNOSIS — G35 Multiple sclerosis: Secondary | ICD-10-CM | POA: Diagnosis not present

## 2023-08-30 DIAGNOSIS — M542 Cervicalgia: Secondary | ICD-10-CM | POA: Diagnosis not present

## 2023-09-03 DIAGNOSIS — I10 Essential (primary) hypertension: Secondary | ICD-10-CM | POA: Diagnosis not present

## 2023-09-03 DIAGNOSIS — G35 Multiple sclerosis: Secondary | ICD-10-CM | POA: Diagnosis not present

## 2023-09-03 DIAGNOSIS — Z23 Encounter for immunization: Secondary | ICD-10-CM | POA: Diagnosis not present

## 2023-09-03 DIAGNOSIS — E1165 Type 2 diabetes mellitus with hyperglycemia: Secondary | ICD-10-CM | POA: Diagnosis not present

## 2023-09-03 DIAGNOSIS — E785 Hyperlipidemia, unspecified: Secondary | ICD-10-CM | POA: Diagnosis not present

## 2023-09-03 DIAGNOSIS — Z0000 Encounter for general adult medical examination without abnormal findings: Secondary | ICD-10-CM | POA: Diagnosis not present

## 2023-09-13 ENCOUNTER — Ambulatory Visit
Admission: RE | Admit: 2023-09-13 | Discharge: 2023-09-13 | Disposition: A | Discharge status: Home / Self Care | Payer: BC Managed Care – PPO | Source: Ambulatory Visit | Attending: Family Medicine | Admitting: Family Medicine

## 2023-09-13 DIAGNOSIS — Z1231 Encounter for screening mammogram for malignant neoplasm of breast: Secondary | ICD-10-CM | POA: Diagnosis not present

## 2023-09-13 DIAGNOSIS — Z0000 Encounter for general adult medical examination without abnormal findings: Secondary | ICD-10-CM

## 2023-10-08 DIAGNOSIS — B356 Tinea cruris: Secondary | ICD-10-CM | POA: Diagnosis not present

## 2023-10-08 DIAGNOSIS — E1165 Type 2 diabetes mellitus with hyperglycemia: Secondary | ICD-10-CM | POA: Diagnosis not present

## 2023-10-08 DIAGNOSIS — I776 Arteritis, unspecified: Secondary | ICD-10-CM | POA: Diagnosis not present

## 2023-10-15 DIAGNOSIS — H468 Other optic neuritis: Secondary | ICD-10-CM | POA: Diagnosis not present

## 2023-10-15 DIAGNOSIS — M542 Cervicalgia: Secondary | ICD-10-CM | POA: Diagnosis not present

## 2023-10-15 DIAGNOSIS — M7918 Myalgia, other site: Secondary | ICD-10-CM | POA: Diagnosis not present

## 2023-10-15 DIAGNOSIS — M47812 Spondylosis without myelopathy or radiculopathy, cervical region: Secondary | ICD-10-CM | POA: Diagnosis not present

## 2023-10-15 DIAGNOSIS — G35 Multiple sclerosis: Secondary | ICD-10-CM | POA: Diagnosis not present

## 2023-10-21 DIAGNOSIS — M62838 Other muscle spasm: Secondary | ICD-10-CM | POA: Diagnosis not present

## 2023-10-21 DIAGNOSIS — G35 Multiple sclerosis: Secondary | ICD-10-CM | POA: Diagnosis not present

## 2023-11-08 ENCOUNTER — Emergency Department (HOSPITAL_COMMUNITY)
Admission: EM | Admit: 2023-11-08 | Discharge: 2023-11-08 | Disposition: A | Discharge status: Home / Self Care | Payer: BC Managed Care – PPO | Attending: Emergency Medicine | Admitting: Emergency Medicine

## 2023-11-08 ENCOUNTER — Emergency Department (HOSPITAL_COMMUNITY): Payer: BC Managed Care – PPO

## 2023-11-08 ENCOUNTER — Other Ambulatory Visit: Payer: Self-pay

## 2023-11-08 ENCOUNTER — Encounter (HOSPITAL_COMMUNITY): Payer: Self-pay

## 2023-11-08 DIAGNOSIS — Z91040 Latex allergy status: Secondary | ICD-10-CM | POA: Diagnosis not present

## 2023-11-08 DIAGNOSIS — R42 Dizziness and giddiness: Secondary | ICD-10-CM | POA: Insufficient documentation

## 2023-11-08 DIAGNOSIS — Z981 Arthrodesis status: Secondary | ICD-10-CM | POA: Diagnosis not present

## 2023-11-08 DIAGNOSIS — I951 Orthostatic hypotension: Secondary | ICD-10-CM

## 2023-11-08 DIAGNOSIS — M4312 Spondylolisthesis, cervical region: Secondary | ICD-10-CM | POA: Diagnosis not present

## 2023-11-08 DIAGNOSIS — Z043 Encounter for examination and observation following other accident: Secondary | ICD-10-CM | POA: Diagnosis not present

## 2023-11-08 DIAGNOSIS — R55 Syncope and collapse: Secondary | ICD-10-CM | POA: Diagnosis not present

## 2023-11-08 DIAGNOSIS — M47812 Spondylosis without myelopathy or radiculopathy, cervical region: Secondary | ICD-10-CM | POA: Diagnosis not present

## 2023-11-08 DIAGNOSIS — G35 Multiple sclerosis: Secondary | ICD-10-CM | POA: Diagnosis not present

## 2023-11-08 LAB — CBC WITH DIFFERENTIAL/PLATELET
Abs Immature Granulocytes: 0.02 10*3/uL (ref 0.00–0.07)
Basophils Absolute: 0 10*3/uL (ref 0.0–0.1)
Basophils Relative: 0 %
Eosinophils Absolute: 0.1 10*3/uL (ref 0.0–0.5)
Eosinophils Relative: 1 %
HCT: 31.8 % — ABNORMAL LOW (ref 36.0–46.0)
Hemoglobin: 10.2 g/dL — ABNORMAL LOW (ref 12.0–15.0)
Immature Granulocytes: 0 %
Lymphocytes Relative: 31 %
Lymphs Abs: 2.7 10*3/uL (ref 0.7–4.0)
MCH: 28.3 pg (ref 26.0–34.0)
MCHC: 32.1 g/dL (ref 30.0–36.0)
MCV: 88.1 fL (ref 80.0–100.0)
Monocytes Absolute: 0.5 10*3/uL (ref 0.1–1.0)
Monocytes Relative: 6 %
Neutro Abs: 5.4 10*3/uL (ref 1.7–7.7)
Neutrophils Relative %: 62 %
Platelets: 309 10*3/uL (ref 150–400)
RBC: 3.61 MIL/uL — ABNORMAL LOW (ref 3.87–5.11)
RDW: 13.9 % (ref 11.5–15.5)
WBC: 8.7 10*3/uL (ref 4.0–10.5)
nRBC: 0 % (ref 0.0–0.2)

## 2023-11-08 LAB — URINALYSIS, ROUTINE W REFLEX MICROSCOPIC
Bilirubin Urine: NEGATIVE
Glucose, UA: NEGATIVE mg/dL
Hgb urine dipstick: NEGATIVE
Ketones, ur: NEGATIVE mg/dL
Nitrite: NEGATIVE
Protein, ur: NEGATIVE mg/dL
Specific Gravity, Urine: 1.01 (ref 1.005–1.030)
pH: 6 (ref 5.0–8.0)

## 2023-11-08 LAB — CBG MONITORING, ED: Glucose-Capillary: 220 mg/dL — ABNORMAL HIGH (ref 70–99)

## 2023-11-08 LAB — BASIC METABOLIC PANEL
Anion gap: 14 (ref 5–15)
BUN: 11 mg/dL (ref 8–23)
CO2: 25 mmol/L (ref 22–32)
Calcium: 9.1 mg/dL (ref 8.9–10.3)
Chloride: 96 mmol/L — ABNORMAL LOW (ref 98–111)
Creatinine, Ser: 1.25 mg/dL — ABNORMAL HIGH (ref 0.44–1.00)
GFR, Estimated: 49 mL/min — ABNORMAL LOW (ref >60)
Glucose, Bld: 217 mg/dL — ABNORMAL HIGH (ref 70–99)
Potassium: 3.4 mmol/L — ABNORMAL LOW (ref 3.5–5.1)
Sodium: 135 mmol/L (ref 135–145)

## 2023-11-08 LAB — TROPONIN I (HIGH SENSITIVITY)
Troponin I (High Sensitivity): 4 ng/L (ref <18)
Troponin I (High Sensitivity): 5 ng/L (ref <18)

## 2023-11-08 LAB — I-STAT CG4 LACTIC ACID, ED
Lactic Acid, Venous: 2.9 mmol/L (ref 0.5–1.9)
Lactic Acid, Venous: 1.7 mmol/L (ref 0.5–1.9)

## 2023-11-08 MED ORDER — GADOBUTROL 1 MMOL/ML IV SOLN
7.0000 mL | Freq: Once | INTRAVENOUS | Status: AC | PRN
  Administered 2023-11-08: 7 mL via INTRAVENOUS

## 2023-11-08 MED ORDER — ACETAMINOPHEN 500 MG PO TABS
1000.0000 mg | ORAL_TABLET | Freq: Once | ORAL | Status: AC
  Administered 2023-11-08: 1000 mg via ORAL
  Filled 2023-11-08: qty 2

## 2023-11-08 MED ORDER — SODIUM CHLORIDE 0.9 % IV BOLUS
1000.0000 mL | Freq: Once | INTRAVENOUS | Status: AC
  Administered 2023-11-08: 1000 mL via INTRAVENOUS

## 2023-11-08 NOTE — ED Triage Notes (Addendum)
 Pt endorses recent multiple falls, dizziness, 2 syncopal episodes; recent diagnosis MS; states she fell today and daughter caught her; c/o leg pain, chronic; recent dental work done, bruising to lower jaw; hypotensive in triage; denies fevers, not on thinners

## 2023-11-08 NOTE — Discharge Instructions (Signed)
 You were seen for fainting in the emergency department.  You were dehydrated and given IV fluids.  At home, please stay well-hydrated.    Check your MyChart online for the results of any tests that had not resulted by the time you left the emergency department.   Follow-up with your primary doctor in 2-3 days regarding your visit.  Talk to your neurologist as well.   Return immediately to the emergency department if you experience any of the following: New numbness or weakness, vision changes, chest pain, shortness of breath, palpitations, or any other concerning symptoms.    Thank you for visiting our Emergency Department. It was a pleasure taking care of you today.

## 2023-11-08 NOTE — ED Notes (Signed)
 Patient transported to x-ray. ?

## 2023-11-08 NOTE — ED Provider Triage Note (Signed)
 Emergency Medicine Provider Triage Evaluation Note  Linda Beltran , a 63 y.o. female  was evaluated in triage.  Pt complains of LOC.  Review of Systems  Positive: LOC, generalized weakness Negative: Rest pain, shortness of breath, abdominal pain nausea vomiting diarrhea  Physical Exam  BP (!) 68/45   Pulse (!) 103   Resp 18   Ht 5' 2 (1.575 m)   Wt 77.1 kg   SpO2 98%   BMI 31.09 kg/m  Gen:   Awake, no distress   Resp:  Normal effort  MSK:   Moves extremities without difficulty  Other:  Patient hypotensive and slightly tachycardic ordering labs patient is being placed in room  Medical Decision Making  Medically screening exam initiated at 2:05 PM.  Appropriate orders placed.  Linda Beltran was informed that the remainder of the evaluation will be completed by another provider, this initial triage assessment does not replace that evaluation, and the importance of remaining in the ED until their evaluation is complete.  History of MS.  Had episode of loss of consciousness.  Her daughter was able to catch her and slowly lowered her to the ground.  Patient has passed out like this before.  Reports she had some generalized weakness before the event.  No neurological deficits on exam.  Alert and answering questions although hypotensive and slightly tachycardic.   Linda Warren SAILOR, PA-C 11/08/23 1407

## 2023-11-08 NOTE — ED Notes (Signed)
RN and daughter assisted pt to restroom.

## 2023-11-08 NOTE — ED Notes (Signed)
 Verbal mse receive

## 2023-11-08 NOTE — ED Notes (Signed)
 Patient transported to CT

## 2023-11-08 NOTE — ED Provider Notes (Signed)
 [place] [facility] AT [facility] Provider Note   CSN: [id number] Arrival date & time: 11/08/23  1329     History  Chief Complaint  Patient presents with   Dizziness    Linda VALLADARES is a 63 y.o. female.  63 year old female with a history of MS who presents to the emergency department with dizziness.  Patient reports that since early December she has been getting dizzy and passing out.  Describes it as a room spinning sensation but typically happens when she stands up.  Had been having approximately 2 falls a week until today when she had 3 falls.  No head strike during these falls.  Did have LOC today but her daughter was able to catch her.  No injuries as result of the falls and is not on blood thinners.  Recently had an MRI of the C-spine with and without contrast with [facility] for her MS.  No new medications in this timeframe.  Does take Dr. Laquetta for her MS and last got a dose on October 9 and is going to have another on March 9.  Had a dental procedure yesterday and has not been eating as much as usual.  No new vision changes, weakness or numbness of her arms or legs, or other new symptoms.  Currently walks with a cane.  Does have some bilateral leg weakness from her MS that is old.       Home Medications Prior to Admission medications   Medication Sig Start Date End Date Taking? Authorizing Provider  amantadine  (SYMMETREL ) 100 MG capsule Take 200 mg by mouth 2 (two) times daily.    [provider]  amLODipine  (NORVASC ) 5 MG tablet Take 1 tablet (5 mg total) by mouth daily. Patient not taking: Reported on 02/11/2023 02/03/23 02/03/24  Singh, Prashant K, MD  cyanocobalamin  (VITAMIN B12) 1000 MCG/ML injection Inject 1 mL (1,000 mcg total) into the skin every 30 (thirty) days. 02/03/23   Singh, Prashant K, MD  Diroximel Fumarate  (VUMERITY ) 231 MG CPDR Take 462 mg by mouth 2 (two) times daily.    [provider]  DULoxetine  (CYMBALTA ) 60 MG capsule  Take 60 mg by mouth daily.    [provider]  hydrALAZINE  (APRESOLINE ) 25 MG tablet Take 25 mg by mouth 3 (three) times daily.    [provider]  Insulin  Syringe-Needle U-100 25G X 1 1 ML MISC 1 cc insulin  syringe and needles for B12 subcu shots once a month dispense 10 needles and syringes 02/03/23   Singh, Prashant K, MD  metFORMIN (GLUCOPHAGE-XR) 500 MG 24 hr tablet Take 500 mg by mouth in the morning and at bedtime.    [provider]  metoprolol  succinate (TOPROL -XL) 50 MG 24 hr tablet Take 50 mg by mouth daily.    [provider]  pregabalin  (LYRICA ) 50 MG capsule Take 50 mg by mouth 2 (two) times daily.    [provider]  Rimegepant Sulfate  (NURTEC) 75 MG TBDP Take 75 mg by mouth daily as needed (migraine).    [provider]  rosuvastatin  (CRESTOR ) 5 MG tablet Take 5 mg by mouth every other day.    [provider]  tiZANidine  (ZANAFLEX ) 2 MG tablet Take 2 mg by mouth every 8 (eight) hours as needed for muscle spasms.    [provider]  valsartan  (DIOVAN ) 160 MG tablet Take 160 mg by mouth daily.    [provider]      Allergies  Amitriptyline, Codeine, Latex, and Neosporin [neomycin-bacitracin zn-polymyx]    Review of Systems   Review of Systems  Physical Exam Updated Vital Signs BP (!) 169/78 (BP Location: Left Arm)   Pulse 80   Temp 98.2 F (36.8 C) (Oral)   Resp 16   Ht 5' 2 (1.575 m)   Wt 77.1 kg   SpO2 98%   BMI 31.09 kg/m  Physical Exam Vitals and nursing note reviewed.  Constitutional:      General: She is not in acute distress.    Appearance: She is well-developed.  HENT:     Head: Normocephalic and atraumatic.     Right Ear: External ear normal.     Left Ear: External ear normal.     Nose: Nose normal.  Eyes:     Extraocular Movements: Extraocular movements intact.     Conjunctiva/sclera: Conjunctivae normal.     Pupils: Pupils are equal, round, and reactive to light.   Cardiovascular:     Rate and Rhythm: Normal rate and regular rhythm.     Heart sounds: No murmur heard. Pulmonary:     Effort: Pulmonary effort is normal. No respiratory distress.     Breath sounds: Normal breath sounds.  Musculoskeletal:     Cervical back: Normal range of motion and neck supple.     Right lower leg: No edema.     Left lower leg: No edema.  Skin:    General: Skin is warm and dry.  Neurological:     Mental Status: She is alert.     Comments: MENTAL STATUS: AAOx3 CRANIAL NERVES: II: Pupils equal and reactive 4 mm BL, no RAPD, no VF deficits III, IV, VI: EOM intact, no gaze preference or deviation, no nystagmus. V: normal sensation to light touch in V1, V2, and V3 segments bilaterally VII: no facial weakness or asymmetry, no nasolabial fold flattening VIII: normal hearing to speech and finger friction IX, X: normal palatal elevation, no uvular deviation XI: 5/5 head turn and 5/5 shoulder shrug bilaterally XII: midline tongue protrusion MOTOR: 5/5 strength in R shoulder flexion, elbow flexion and extension, and grip strength. 5/5 strength in L shoulder flexion, elbow flexion and extension, and grip strength.  5/5 strength in R hip and knee flexion, knee extension, ankle plantar and dorsiflexion. 5/5 strength in L hip and knee flexion, knee extension, ankle plantar and dorsiflexion. SENSORY: Normal sensation to light touch in all extremities COORD: Normal finger to nose and heel to shin, no tremor, no dysmetria STATION: Truncal ataxia present.  Unable to stand without assistance due to dizziness.  Unable to walk due to dizziness.   Psychiatric:        Mood and Affect: Mood normal.     ED Results / Procedures / Treatments   Labs (all labs ordered are listed, but only abnormal results are displayed) Labs Reviewed  BASIC METABOLIC PANEL - Abnormal; Notable for the following components:      Result Value   Potassium 3.4 (*)    Chloride 96 (*)    Glucose, Bld 217  (*)    Creatinine, Ser 1.25 (*)    GFR, Estimated 49 (*)    All other components within normal limits  CBC WITH DIFFERENTIAL/PLATELET - Abnormal; Notable for the following components:   RBC 3.61 (*)    Hemoglobin 10.2 (*)    HCT 31.8 (*)    All other components within normal limits  URINALYSIS, ROUTINE W REFLEX MICROSCOPIC - Abnormal; Notable for the following components:  APPearance HAZY (*)    Leukocytes,Ua SMALL (*)    Bacteria, UA RARE (*)    All other components within normal limits  CBG MONITORING, ED - Abnormal; Notable for the following components:   Glucose-Capillary 220 (*)    All other components within normal limits  I-STAT CG4 LACTIC ACID, ED - Abnormal; Notable for the following components:   Lactic Acid, Venous 2.9 (*)    All other components within normal limits  I-STAT CG4 LACTIC ACID, ED  TROPONIN I (HIGH SENSITIVITY)  TROPONIN I (HIGH SENSITIVITY)    EKG EKG Interpretation Date/Time:  Friday November 08 2023 13:53:51 EST Ventricular Rate:  97 PR Interval:  166 QRS Duration:  84 QT Interval:  356 QTC Calculation: 452 R Axis:   49  Text Interpretation: Normal sinus rhythm Nonspecific ST and T wave abnormality Abnormal ECG Confirmed by Yolande Charleston [phone]) on 11/08/2023 3:13:54 PM  Radiology MR Brain W and Wo Contrast Result Date: 11/08/2023 CLINICAL DATA:  vertigo, hx of ms EXAM: MRI HEAD WITHOUT AND WITH CONTRAST TECHNIQUE: Multiplanar, multiecho pulse sequences of the brain and surrounding structures were obtained without and with intravenous contrast. CONTRAST:  7mL GADAVIST  GADOBUTROL  1 MMOL/ML IV SOLN COMPARISON:  MRI head March 30, 24. FINDINGS: Brain: No acute infarction, hemorrhage, hydrocephalus, extra-axial collection or mass lesion. Similar mild T2/FLAIR hyperintensities in the predominately periventricular white matter. No pathologic enhancement. Vascular: Major arterial flow voids are maintained the skull base. Skull and upper cervical spine:  Normal marrow signal. Sinuses/Orbits: Mostly clear stenosis.  No acute orbital findings. Other: No mastoid effusions. IMPRESSION: 1. No evidence of acute intracranial abnormality. 2. Similar mild T2/FLAIR hyperintensities in the predominately periventricular white matter, compatible with reported history of multiple sclerosis. No enhancing lesions. Electronically Signed   By: Gilmore GORMAN Molt M.D.   On: 11/08/2023 21:10   DG Chest 2 View Result Date: 11/08/2023 CLINICAL DATA:  Multiple falls.  Loss of consciousness. EXAM: CHEST - 2 VIEW COMPARISON:  None Available. FINDINGS: Bilateral lung fields are clear. Bilateral costophrenic angles are clear. Normal cardio-mediastinal silhouette. No acute osseous abnormalities. Partially seen lower cervical spinal fixation hardware. The soft tissues are within normal limits. IMPRESSION: No active cardiopulmonary disease. Electronically Signed   By: Ree Molt M.D.   On: 11/08/2023 16:19    Procedures Procedures    Medications Ordered in ED Medications  sodium chloride  0.9 % bolus 1,000 mL (0 mLs Intravenous Stopped 11/08/23 1630)  gadobutrol  (GADAVIST ) 1 MMOL/ML injection 7 mL (7 mLs Intravenous Contrast Given 11/08/23 1708)  acetaminophen  (TYLENOL ) tablet 1,000 mg (1,000 mg Oral Given 11/08/23 1808)    ED Course/ Medical Decision Making/ A&P                                 Medical Decision Making Amount and/or Complexity of Data Reviewed Radiology: ordered.  Risk OTC drugs. Prescription drug management.   PRISILA Beltran is a 63 y.o. female with comorbidities that complicate the patient evaluation including MS who presents to the emergency department with dizziness and syncope  Initial Ddx:  Autonomic dysfunction, dehydration, vertigo/MS, medication side effect  MDM/Course:  Patient presents to the emergency department with syncopal event.  Appears to have decreased p.o. intake recently especially after dental procedure.  On exam is overall  well-appearing without any significant signs of head trauma.  Has a nonfocal neuroexam aside from not being able to stand up or walk without  assistance.  She was given IV fluid bolus for low blood pressure and it normalized after 1 L.  Her symptoms markedly improved.  She also had an MRI of the brain just to ensure that there were no new lesions that would be causing vertigo.  Her MRI.  Similar to prior.  Upon re-evaluation is feeling much better and is requesting go home.  Feel this is reasonable.  Will have her follow-up with her primary doctor and neurologist regarding her symptoms.  Blood pressure was also somewhat elevated but given the fact that she is having orthostasis we will hold off on prescribing additional medications at this time.  This patient presents to the ED for concern of complaints listed in HPI, this involves an extensive number of treatment options, and is a complaint that carries with it a high risk of complications and morbidity. Disposition including potential need for admission considered.   Dispo: DC Home. Return precautions discussed including, but not limited to, those listed in the AVS. Allowed pt time to ask questions which were answered fully prior to dc.  Additional history obtained from daughter Records reviewed Outpatient Clinic Notes The following labs were independently interpreted: Chemistry and show no acute abnormality I independently reviewed the following imaging with scope of interpretation limited to determining acute life threatening conditions related to emergency care: Chest x-ray and agree with the radiologist interpretation with the following exceptions: none I personally reviewed and interpreted cardiac monitoring: normal sinus rhythm  I personally reviewed and interpreted the pt's EKG: see above for interpretation  I have reviewed the patients home medications and made adjustments as needed  Portions of this note were generated with Dragon dictation  software. Dictation errors may occur despite best attempts at proofreading.     Final Clinical Impression(s) / ED Diagnoses Final diagnoses:  Orthostasis  Syncope and collapse    Rx / DC Orders ED Discharge Orders     None         Yolande Lamar BROCKS, MD 11/09/23 1121

## 2023-11-08 NOTE — ED Notes (Signed)
 Pt provided sample cup

## 2023-11-12 DIAGNOSIS — I10 Essential (primary) hypertension: Secondary | ICD-10-CM | POA: Diagnosis not present

## 2023-11-12 DIAGNOSIS — D649 Anemia, unspecified: Secondary | ICD-10-CM | POA: Diagnosis not present

## 2023-11-12 DIAGNOSIS — E1165 Type 2 diabetes mellitus with hyperglycemia: Secondary | ICD-10-CM | POA: Diagnosis not present

## 2023-11-12 DIAGNOSIS — R42 Dizziness and giddiness: Secondary | ICD-10-CM | POA: Diagnosis not present

## 2023-11-13 DIAGNOSIS — G35 Multiple sclerosis: Secondary | ICD-10-CM | POA: Diagnosis not present

## 2023-12-16 DIAGNOSIS — G35 Multiple sclerosis: Secondary | ICD-10-CM | POA: Diagnosis not present

## 2023-12-16 DIAGNOSIS — R1310 Dysphagia, unspecified: Secondary | ICD-10-CM | POA: Diagnosis not present

## 2023-12-17 DIAGNOSIS — G35 Multiple sclerosis: Secondary | ICD-10-CM | POA: Diagnosis not present

## 2023-12-17 DIAGNOSIS — R296 Repeated falls: Secondary | ICD-10-CM | POA: Diagnosis not present

## 2023-12-17 DIAGNOSIS — R42 Dizziness and giddiness: Secondary | ICD-10-CM | POA: Diagnosis not present

## 2023-12-17 DIAGNOSIS — I10 Essential (primary) hypertension: Secondary | ICD-10-CM | POA: Diagnosis not present

## 2023-12-20 DIAGNOSIS — R8290 Unspecified abnormal findings in urine: Secondary | ICD-10-CM | POA: Diagnosis not present

## 2023-12-20 DIAGNOSIS — R296 Repeated falls: Secondary | ICD-10-CM | POA: Diagnosis not present

## 2023-12-27 DIAGNOSIS — M47812 Spondylosis without myelopathy or radiculopathy, cervical region: Secondary | ICD-10-CM | POA: Diagnosis not present

## 2024-01-10 DIAGNOSIS — M47812 Spondylosis without myelopathy or radiculopathy, cervical region: Secondary | ICD-10-CM | POA: Diagnosis not present

## 2024-01-17 DIAGNOSIS — G959 Disease of spinal cord, unspecified: Secondary | ICD-10-CM | POA: Diagnosis not present

## 2024-01-17 DIAGNOSIS — G35 Multiple sclerosis: Secondary | ICD-10-CM | POA: Diagnosis not present

## 2024-01-17 DIAGNOSIS — M4802 Spinal stenosis, cervical region: Secondary | ICD-10-CM | POA: Diagnosis not present

## 2024-01-27 DIAGNOSIS — G35 Multiple sclerosis: Secondary | ICD-10-CM | POA: Diagnosis not present

## 2024-02-03 DIAGNOSIS — G43909 Migraine, unspecified, not intractable, without status migrainosus: Secondary | ICD-10-CM | POA: Diagnosis not present

## 2024-02-03 DIAGNOSIS — Z7984 Long term (current) use of oral hypoglycemic drugs: Secondary | ICD-10-CM | POA: Diagnosis not present

## 2024-02-03 DIAGNOSIS — I10 Essential (primary) hypertension: Secondary | ICD-10-CM | POA: Diagnosis not present

## 2024-02-03 DIAGNOSIS — G35 Multiple sclerosis: Secondary | ICD-10-CM | POA: Diagnosis not present

## 2024-02-03 DIAGNOSIS — N39498 Other specified urinary incontinence: Secondary | ICD-10-CM | POA: Diagnosis not present

## 2024-02-03 DIAGNOSIS — Z882 Allergy status to sulfonamides status: Secondary | ICD-10-CM | POA: Diagnosis not present

## 2024-02-03 DIAGNOSIS — E785 Hyperlipidemia, unspecified: Secondary | ICD-10-CM | POA: Diagnosis not present

## 2024-02-03 DIAGNOSIS — Z884 Allergy status to anesthetic agent status: Secondary | ICD-10-CM | POA: Diagnosis not present

## 2024-02-03 DIAGNOSIS — F32A Depression, unspecified: Secondary | ICD-10-CM | POA: Diagnosis not present

## 2024-02-03 DIAGNOSIS — R768 Other specified abnormal immunological findings in serum: Secondary | ICD-10-CM | POA: Diagnosis not present

## 2024-02-03 DIAGNOSIS — Z7985 Long-term (current) use of injectable non-insulin antidiabetic drugs: Secondary | ICD-10-CM | POA: Diagnosis not present

## 2024-02-03 DIAGNOSIS — G9520 Unspecified cord compression: Secondary | ICD-10-CM | POA: Diagnosis not present

## 2024-02-03 DIAGNOSIS — Z885 Allergy status to narcotic agent status: Secondary | ICD-10-CM | POA: Diagnosis not present

## 2024-02-03 DIAGNOSIS — Z79899 Other long term (current) drug therapy: Secondary | ICD-10-CM | POA: Diagnosis not present

## 2024-02-03 DIAGNOSIS — Z888 Allergy status to other drugs, medicaments and biological substances status: Secondary | ICD-10-CM | POA: Diagnosis not present

## 2024-02-03 DIAGNOSIS — E1140 Type 2 diabetes mellitus with diabetic neuropathy, unspecified: Secondary | ICD-10-CM | POA: Diagnosis not present

## 2024-02-03 DIAGNOSIS — M3500 Sicca syndrome, unspecified: Secondary | ICD-10-CM | POA: Diagnosis not present

## 2024-02-03 DIAGNOSIS — Z91040 Latex allergy status: Secondary | ICD-10-CM | POA: Diagnosis not present

## 2024-02-06 ENCOUNTER — Emergency Department (HOSPITAL_COMMUNITY)

## 2024-02-06 ENCOUNTER — Other Ambulatory Visit: Payer: Self-pay

## 2024-02-06 ENCOUNTER — Emergency Department (HOSPITAL_COMMUNITY)
Admission: EM | Admit: 2024-02-06 | Discharge: 2024-02-06 | Disposition: A | Discharge status: Home / Self Care | Attending: Emergency Medicine | Admitting: Emergency Medicine

## 2024-02-06 DIAGNOSIS — R1033 Periumbilical pain: Secondary | ICD-10-CM | POA: Diagnosis not present

## 2024-02-06 DIAGNOSIS — R197 Diarrhea, unspecified: Secondary | ICD-10-CM | POA: Insufficient documentation

## 2024-02-06 DIAGNOSIS — Z91040 Latex allergy status: Secondary | ICD-10-CM | POA: Insufficient documentation

## 2024-02-06 DIAGNOSIS — R531 Weakness: Secondary | ICD-10-CM | POA: Diagnosis not present

## 2024-02-06 DIAGNOSIS — R000 Tachycardia, unspecified: Secondary | ICD-10-CM | POA: Diagnosis not present

## 2024-02-06 DIAGNOSIS — R109 Unspecified abdominal pain: Secondary | ICD-10-CM | POA: Diagnosis not present

## 2024-02-06 DIAGNOSIS — D259 Leiomyoma of uterus, unspecified: Secondary | ICD-10-CM | POA: Diagnosis not present

## 2024-02-06 DIAGNOSIS — R0602 Shortness of breath: Secondary | ICD-10-CM | POA: Diagnosis not present

## 2024-02-06 DIAGNOSIS — R1084 Generalized abdominal pain: Secondary | ICD-10-CM | POA: Diagnosis not present

## 2024-02-06 DIAGNOSIS — Z9049 Acquired absence of other specified parts of digestive tract: Secondary | ICD-10-CM | POA: Diagnosis not present

## 2024-02-06 LAB — COMPREHENSIVE METABOLIC PANEL WITH GFR
ALT: 34 U/L (ref 0–44)
AST: 55 U/L — ABNORMAL HIGH (ref 15–41)
Albumin: 3.8 g/dL (ref 3.5–5.0)
Alkaline Phosphatase: 93 U/L (ref 38–126)
Anion gap: 16 — ABNORMAL HIGH (ref 5–15)
BUN: 10 mg/dL (ref 8–23)
CO2: 20 mmol/L — ABNORMAL LOW (ref 22–32)
Calcium: 9.8 mg/dL (ref 8.9–10.3)
Chloride: 101 mmol/L (ref 98–111)
Creatinine, Ser: 1.24 mg/dL — ABNORMAL HIGH (ref 0.44–1.00)
GFR, Estimated: 50 mL/min — ABNORMAL LOW (ref >60)
Glucose, Bld: 155 mg/dL — ABNORMAL HIGH (ref 70–99)
Potassium: 3.2 mmol/L — ABNORMAL LOW (ref 3.5–5.1)
Sodium: 137 mmol/L (ref 135–145)
Total Bilirubin: 0.7 mg/dL (ref 0.0–1.2)
Total Protein: 8.4 g/dL — ABNORMAL HIGH (ref 6.5–8.1)

## 2024-02-06 LAB — URINALYSIS, ROUTINE W REFLEX MICROSCOPIC
Bilirubin Urine: NEGATIVE
Glucose, UA: NEGATIVE mg/dL
Hgb urine dipstick: NEGATIVE
Ketones, ur: 15 mg/dL — AB
Leukocytes,Ua: NEGATIVE
Nitrite: NEGATIVE
Protein, ur: NEGATIVE mg/dL
Specific Gravity, Urine: 1.01 (ref 1.005–1.030)
pH: 7 (ref 5.0–8.0)

## 2024-02-06 LAB — CBC WITH DIFFERENTIAL/PLATELET
Abs Immature Granulocytes: 0.04 10*3/uL (ref 0.00–0.07)
Basophils Absolute: 0 10*3/uL (ref 0.0–0.1)
Basophils Relative: 0 %
Eosinophils Absolute: 0.1 10*3/uL (ref 0.0–0.5)
Eosinophils Relative: 1 %
HCT: 40.7 % (ref 36.0–46.0)
Hemoglobin: 13.3 g/dL (ref 12.0–15.0)
Immature Granulocytes: 0 %
Lymphocytes Relative: 39 %
Lymphs Abs: 3.6 10*3/uL (ref 0.7–4.0)
MCH: 27.9 pg (ref 26.0–34.0)
MCHC: 32.7 g/dL (ref 30.0–36.0)
MCV: 85.3 fL (ref 80.0–100.0)
Monocytes Absolute: 0.8 10*3/uL (ref 0.1–1.0)
Monocytes Relative: 8 %
Neutro Abs: 4.9 10*3/uL (ref 1.7–7.7)
Neutrophils Relative %: 52 %
Platelets: 306 10*3/uL (ref 150–400)
RBC: 4.77 MIL/uL (ref 3.87–5.11)
RDW: 12.7 % (ref 11.5–15.5)
WBC: 9.4 10*3/uL (ref 4.0–10.5)
nRBC: 0 % (ref 0.0–0.2)

## 2024-02-06 LAB — RESP PANEL BY RT-PCR (RSV, FLU A&B, COVID)  RVPGX2
Influenza A by PCR: NEGATIVE
Influenza B by PCR: NEGATIVE
Resp Syncytial Virus by PCR: NEGATIVE
SARS Coronavirus 2 by RT PCR: NEGATIVE

## 2024-02-06 LAB — I-STAT CG4 LACTIC ACID, ED
Lactic Acid, Venous: 4.4 mmol/L (ref 0.5–1.9)
Lactic Acid, Venous: 1.2 mmol/L (ref 0.5–1.9)

## 2024-02-06 LAB — LIPASE, BLOOD: Lipase: 39 U/L (ref 11–51)

## 2024-02-06 MED ORDER — LACTATED RINGERS IV BOLUS (SEPSIS)
1000.0000 mL | Freq: Once | INTRAVENOUS | Status: AC
  Administered 2024-02-06: 1000 mL via INTRAVENOUS

## 2024-02-06 MED ORDER — POTASSIUM CHLORIDE 10 MEQ/100ML IV SOLN
10.0000 meq | Freq: Once | INTRAVENOUS | Status: DC
  Filled 2024-02-06: qty 100

## 2024-02-06 MED ORDER — IOHEXOL 350 MG/ML SOLN
75.0000 mL | Freq: Once | INTRAVENOUS | Status: AC | PRN
  Administered 2024-02-06: 75 mL via INTRAVENOUS

## 2024-02-06 MED ORDER — POTASSIUM CHLORIDE 20 MEQ PO PACK
20.0000 meq | PACK | Freq: Once | ORAL | Status: AC
  Administered 2024-02-06: 20 meq via ORAL
  Filled 2024-02-06: qty 1

## 2024-02-06 MED ORDER — MAGNESIUM SULFATE 2 GM/50ML IV SOLN
2.0000 g | Freq: Once | INTRAVENOUS | Status: AC
  Administered 2024-02-06: 2 g via INTRAVENOUS
  Filled 2024-02-06: qty 50

## 2024-02-06 NOTE — Discharge Instructions (Addendum)
 You were seen in the emergency department today for abdominal pain in the setting of significant diarrhea this morning.  Your lab work was overall reassuring.  You had slightly low potassium, which is also consistent with low magnesium.  We have given you repletion of these electrolytes.  We have also given you fluid through the IV.  The scan of your abdomen and pelvis was reassuring and did not show any free air or fluid or obstruction.  Continue to eat bland, small meals.  Continue to drink fluids such as water.  Utilize Tylenol for pain.  Continue your home medications as prescribed.

## 2024-02-06 NOTE — ED Provider Triage Note (Signed)
 Emergency Medicine Provider Triage Evaluation Note  Linda Beltran , a 63 y.o. female  was evaluated in triage.  Pt complains of abdominal cramping that began a few hours ago.  Patient states that she has had multiple episodes of loose stools but cannot say how many.  Patient has any nausea or vomiting chest pain or shortness of breath.  Patient was initially hypotensive with EMS with systolic of 98 however responded to fluids.  Patient denies any fevers but is noticeably tachypneic on exam with increased work of breathing but denies any shortness of breath, fevers, sick contacts.  Patient denies any dysuria.  Review of Systems  Positive:  Negative:   Physical Exam  BP 116/67 (BP Location: Right Arm)   Pulse (!) 105   Temp 97.6 F (36.4 C) (Oral)   Resp 16   Ht 5\' 2"  (1.575 m)   Wt 77.1 kg   SpO2 100%   BMI 31.09 kg/m  Gen:   Awake, distressed Respiratory: Increased work of breathing however lungs are clear to auscultation bilaterally MSK:   Moves extremities without difficulty  Other:  No abdominal tenderness or peritoneal signs  Medical Decision Making  Medically screening exam initiated at 1:41 PM.  Appropriate orders placed.  Fransico Him was informed that the remainder of the evaluation will be completed by another provider, this initial triage assessment does not replace that evaluation, and the importance of remaining in the ED until their evaluation is complete.  Workup initiated, patient has soft nontender abdomen with me however on exam is noticeably tachypneic with increased work of breathing.  Patient stated multiple times she does not feel short of breath nor has any chest pain however vitals with EMS do show respiratory rate of 36.  Patient's vitals here are ultimately okay outside of minor tachycardia of 105 and does not currently meet sepsis criteria.  Will get labs along with chest x-ray.  Patient should not go out to the waiting room.   [name],  PA-C 02/06/24 1344

## 2024-02-06 NOTE — ED Notes (Signed)
 Difficulty obtaining blood cultures. MD notified of delay. Phlebotomy asked to see patient but phlebotomy reports to this RN that only one set was able to be obtained at this time.

## 2024-02-06 NOTE — ED Notes (Signed)
 Lactic acid flagged at 4.37 shelya b.rn by at

## 2024-02-06 NOTE — ED Triage Notes (Signed)
 Pt BIB EMS for c/o generalized weakness. Unable to get up off toilet. Having ABD cramping since a couple of hours ago and some diarrhea. No NV. Bp 98/60 HR 90 RR36 98% ra CBG 221. Received NS with EMS. Neuro check at pt baseline. Pt has hx of MS

## 2024-02-06 NOTE — ED Provider Notes (Signed)
 [place] EMERGENCY DEPARTMENT AT [facility] Provider Note   CSN: [id number] Arrival date & time: 02/06/24  1329     History  No chief complaint on file.   Linda Beltran is a 64 y.o. female reports acute onset periumbilical abdominal pain that started this morning.  Patient has had several episodes of diarrhea as of this morning.  Reports she could not get off the toilet.  She also endorses nausea.    No emesis.  No blood in her stool.  No dysuria.  No cough.  No fever.  No chest pain or shortness of breath.  History of MS -saw neurologist on Monday and has medication changes planned, however has not initiated any medication changes at this time.  HPI     Home Medications Prior to Admission medications   Medication Sig Start Date End Date Taking? Authorizing Provider  amantadine (SYMMETREL) 100 MG capsule Take 200 mg by mouth 2 (two) times daily.    [provider]  amLODipine (NORVASC) 5 MG tablet Take 1 tablet (5 mg total) by mouth daily. Patient not taking: Reported on 02/11/2023 02/03/23 02/03/24  Leroy Sea, MD  cyanocobalamin (VITAMIN B12) 1000 MCG/ML injection Inject 1 mL (1,000 mcg total) into the skin every 30 (thirty) days. 02/03/23   Leroy Sea, MD  Diroximel Fumarate (VUMERITY) 231 MG CPDR Take 462 mg by mouth 2 (two) times daily.    [provider]  DULoxetine (CYMBALTA) 60 MG capsule Take 60 mg by mouth daily.    [provider]  hydrALAZINE (APRESOLINE) 25 MG tablet Take 25 mg by mouth 3 (three) times daily.    [provider]  Insulin Syringe-Needle U-100 25G X 1" 1 ML MISC 1 cc insulin syringe and needles for B12 subcu shots once a month dispense 10 needles and syringes 02/03/23   Leroy Sea, MD  metFORMIN (GLUCOPHAGE-XR) 500 MG 24 hr tablet Take 500 mg by mouth in the morning and at bedtime.    [provider]  metoprolol succinate (TOPROL-XL) 50 MG 24 hr tablet Take 50 mg by mouth daily.     [provider]  pregabalin (LYRICA) 50 MG capsule Take 50 mg by mouth 2 (two) times daily.    [provider]  Rimegepant Sulfate (NURTEC) 75 MG TBDP Take 75 mg by mouth daily as needed (migraine).    [provider]  rosuvastatin (CRESTOR) 5 MG tablet Take 5 mg by mouth every other day.    [provider]  tiZANidine (ZANAFLEX) 2 MG tablet Take 2 mg by mouth every 8 (eight) hours as needed for muscle spasms.    [provider]  valsartan (DIOVAN) 160 MG tablet Take 160 mg by mouth daily.    [provider]      Allergies    Amitriptyline, Codeine, Latex, and Neosporin [neomycin-bacitracin zn-polymyx]    Review of Systems   Review of Systems  Physical Exam Updated Vital Signs BP (!) 178/84 (BP Location: Right Arm)   Pulse 93   Temp 98.1 F (36.7 C) (Oral)   Resp 16   Ht 5\' 2"  (1.575 m)   Wt 77.1 kg   SpO2 98%   BMI 31.09 kg/m  Physical Exam Vitals and nursing note reviewed.  Constitutional:      General: She is not in acute distress.    Appearance: Normal appearance. She is not ill-appearing.  HENT:     Head: Normocephalic and atraumatic.  Mouth/Throat:     Lips: Pink.     Mouth: Mucous membranes are moist.     Pharynx: Oropharynx is clear.  Eyes:     Pupils: Pupils are equal, round, and reactive to light.  Cardiovascular:     Rate and Rhythm: Normal rate and regular rhythm.     Pulses:          Radial pulses are 2+ on the right side and 2+ on the left side.       Dorsalis pedis pulses are 2+ on the right side.     Heart sounds: Normal heart sounds.  Pulmonary:     Effort: Pulmonary effort is normal.     Breath sounds: Examination of the right-upper field reveals wheezing. Examination of the left-upper field reveals wheezing. Examination of the right-middle field reveals wheezing. Examination of the left-middle field reveals wheezing. Examination of the right-lower field reveals wheezing. Examination of the  left-lower field reveals wheezing. Wheezing present.     Comments: End expiratory wheezes Abdominal:     General: Abdomen is flat. There is no distension.     Palpations: Abdomen is soft.     Tenderness: There is abdominal tenderness in the right upper quadrant, epigastric area and periumbilical area. There is no right CVA tenderness or left CVA tenderness.  Musculoskeletal:     Right lower leg: No edema.     Left lower leg: No edema.  Skin:    General: Skin is warm and dry.     Capillary Refill: Capillary refill takes less than 2 seconds.  Neurological:     Mental Status: She is alert.  Psychiatric:        Behavior: Behavior is cooperative.     ED Results / Procedures / Treatments   Labs (all labs ordered are listed, but only abnormal results are displayed) Labs Reviewed  COMPREHENSIVE METABOLIC PANEL WITH GFR - Abnormal; Notable for the following components:      Result Value   Potassium 3.2 (*)    CO2 20 (*)    Glucose, Bld 155 (*)    Creatinine, Ser 1.24 (*)    Total Protein 8.4 (*)    AST 55 (*)    GFR, Estimated 50 (*)    Anion gap 16 (*)    All other components within normal limits  URINALYSIS, ROUTINE W REFLEX MICROSCOPIC - Abnormal; Notable for the following components:   Ketones, ur 15 (*)    All other components within normal limits  I-STAT CG4 LACTIC ACID, ED - Abnormal; Notable for the following components:   Lactic Acid, Venous 4.4 (*)    All other components within normal limits  RESP PANEL BY RT-PCR (RSV, FLU A&B, COVID)  RVPGX2  CULTURE, BLOOD (ROUTINE X 2)  CULTURE, BLOOD (ROUTINE X 2)  CBC WITH DIFFERENTIAL/PLATELET  LIPASE, BLOOD  I-STAT CG4 LACTIC ACID, ED    EKG EKG Interpretation Date/Time:  Thursday February 06 2024 13:39:53 EDT Ventricular Rate:  107 PR Interval:  148 QRS Duration:  76 QT Interval:  370 QTC Calculation: 493 R Axis:   73  Text Interpretation: Sinus tachycardia ST & T wave abnormality, consider inferior ischemia Abnormal  ECG When compared with ECG of 08-Nov-2023 13:53, PREVIOUS ECG IS PRESENT No acute changes No significant change since last tracing ST depression inferior leads noted Confirmed by Derwood Kaplan [phone]) on 02/06/2024 4:28:34 PM  Radiology CT ABDOMEN PELVIS W CONTRAST Result Date: 02/06/2024 CLINICAL DATA:  Acute generalized abdominal pain. EXAM: CT  ABDOMEN AND PELVIS WITH CONTRAST TECHNIQUE: Multidetector CT imaging of the abdomen and pelvis was performed using the standard protocol following bolus administration of intravenous contrast. RADIATION DOSE REDUCTION: This exam was performed according to the departmental dose-optimization program which includes automated exposure control, adjustment of the mA and/or kV according to patient size and/or use of iterative reconstruction technique. CONTRAST:  75mL OMNIPAQUE IOHEXOL 350 MG/ML SOLN COMPARISON:  None Available. FINDINGS: Lower chest: No acute abnormality. Hepatobiliary: No focal liver abnormality is seen. Status post cholecystectomy. No biliary dilatation. Pancreas: Unremarkable. No pancreatic ductal dilatation or surrounding inflammatory changes. Spleen: Normal in size without focal abnormality. Adrenals/Urinary Tract: Adrenal glands are unremarkable. Kidneys are normal, without renal calculi, focal lesion, or hydronephrosis. Bladder is unremarkable. Stomach/Bowel: Stomach is unremarkable. There is no evidence of bowel obstruction. There is no definite evidence of bowel inflammation. Appendix is not clearly identified. Vascular/Lymphatic: No significant vascular findings are present. No enlarged abdominal or pelvic lymph nodes. Reproductive: 3.8 cm uterine fibroid is noted. No adnexal abnormality is noted. Other: No ascites or hernia is noted. Musculoskeletal: No acute or significant osseous findings. IMPRESSION: 3.8 cm uterine fibroid. No definite acute abnormality seen in the abdomen or pelvis. Electronically Signed   By: Lupita Raider M.D.   On:  02/06/2024 18:30   DG Chest 1 View Result Date: 02/06/2024 CLINICAL DATA:  Shortness of breath. EXAM: CHEST  1 VIEW COMPARISON:  Chest radiograph dated 11/08/2023. FINDINGS: The heart size and mediastinal contours are within normal limits. No focal consolidation, sizeable pleural effusion, or pneumothorax. Partially visualized lower cervical fixation hardware. No acute osseous abnormality. IMPRESSION: No acute cardiopulmonary findings. Electronically Signed   By: Hart Robinsons M.D.   On: 02/06/2024 14:58    Procedures Procedures   Medications Ordered in ED Medications  lactated ringers bolus 1,000 mL (0 mLs Intravenous Stopped 02/06/24 2052)  magnesium sulfate IVPB 2 g 50 mL (0 g Intravenous Stopped 02/06/24 2052)  iohexol (OMNIPAQUE) 350 MG/ML injection 75 mL (75 mLs Intravenous Contrast Given 02/06/24 1814)  potassium chloride (KLOR-CON) packet 20 mEq (20 mEq Oral Given 02/06/24 2055)    ED Course/ Medical Decision Making/ A&P                               Medical Decision Making Amount and/or Complexity of Data Reviewed Radiology: ordered.  Risk Prescription drug management.   63 y.o. female PMHx MS here for acute onset periumbilical abdominal pain that started this morning.  Vital signs reassuring.  Physical exam with periumbilical, epigastric, right upper quadrant tenderness to palpation.  Soft and nondistended abdomen.  Differential diagnosis includes bowel obstruction, mesenteric ischemia, viral illness, constipation, pancreatitis, nephrolithiasis, cholecystitis, UTI/pyelonephritis  Administered 1 L LR for fluid resuscitation in the setting of limited p.o. with significant diarrhea.  EKG was obtained upon arrival notable for sinus tachycardia with a rate of 109.  Appropriate PR, QRS intervals.  Borderline prolonged QTc. On my evaluation, patient's heart rate was in the 80s and while I was in the room.  On my review, CXR notable for no focal consolidation concerning for pneumonia,  no pneumothorax, no pleural effusion  Lab work notable for no leukocytosis, stable hemoglobin.  Slight hypokalemia, and presumed hypomagnesemia.  Will replete.  Creatinine elevated at 1.24; this is similar to patient's baseline.  Initial lactic acid 4.4; 2 hours later improved to 1.2.  Lipase 39; low suspicion for pancreatitis.  COVID/flu negative. CT abdomen  pelvis ordered for intra-abdominal evaluation.  CT abdomen and pelvis showed 3.8 cm uterine fibroid, however there was no evidence of pneumoperitoneum, ischemia, free fluid.  Patient has had a cholecystectomy.  No inflammation surrounding the pancreas.  No noted hydronephrosis.  No finding of bowel obstruction.  Urinalysis was collected with a significant delay in results secondary to the labs system error.  Eventually, urinalysis resulted with low suspicion for infection.  Patient's pain improved during her time in the ED.  Likely the patient's abdominal pain is secondary to a diarrheal illness.  Advised patient to maintain bland diet with good fluid rehydration.  Patient was discharged in hemodynamically stable condition.  Final Clinical Impression(s) / ED Diagnoses Final diagnoses:  Periumbilical abdominal pain  Diarrhea, unspecified type    Rx / DC Orders ED Discharge Orders     None      Renella Cunas, PGY-2 Emergency Medicine   Renella Cunas, MD 02/07/24 1610    Derwood Kaplan, MD 02/07/24 1712

## 2024-02-06 NOTE — ED Notes (Signed)
 This RN called main lab to check status of previously sent urine specimen. Lab reports they are experiencing difficulties and it would take about another 15 minutes. Patient and family notified of delay. MD notified.

## 2024-02-10 DIAGNOSIS — E1165 Type 2 diabetes mellitus with hyperglycemia: Secondary | ICD-10-CM | POA: Diagnosis not present

## 2024-02-10 DIAGNOSIS — I10 Essential (primary) hypertension: Secondary | ICD-10-CM | POA: Diagnosis not present

## 2024-02-10 DIAGNOSIS — Z1211 Encounter for screening for malignant neoplasm of colon: Secondary | ICD-10-CM | POA: Diagnosis not present

## 2024-02-10 DIAGNOSIS — R42 Dizziness and giddiness: Secondary | ICD-10-CM | POA: Diagnosis not present

## 2024-02-11 LAB — CULTURE, BLOOD (ROUTINE X 2)
Culture: NO GROWTH
Culture: NO GROWTH

## 2024-02-20 DIAGNOSIS — G35 Multiple sclerosis: Secondary | ICD-10-CM | POA: Diagnosis not present

## 2024-02-26 DIAGNOSIS — K5900 Constipation, unspecified: Secondary | ICD-10-CM | POA: Diagnosis not present

## 2024-02-26 DIAGNOSIS — R197 Diarrhea, unspecified: Secondary | ICD-10-CM | POA: Diagnosis not present

## 2024-02-26 DIAGNOSIS — R110 Nausea: Secondary | ICD-10-CM | POA: Diagnosis not present

## 2024-02-26 DIAGNOSIS — R1310 Dysphagia, unspecified: Secondary | ICD-10-CM | POA: Diagnosis not present

## 2024-02-27 ENCOUNTER — Ambulatory Visit: Admitting: Orthopedic Surgery

## 2024-02-27 ENCOUNTER — Other Ambulatory Visit (INDEPENDENT_AMBULATORY_CARE_PROVIDER_SITE_OTHER): Payer: Self-pay

## 2024-02-27 VITALS — BP 112/76 | HR 92 | Ht 62.0 in | Wt 153.0 lb

## 2024-02-27 DIAGNOSIS — M542 Cervicalgia: Secondary | ICD-10-CM

## 2024-02-27 NOTE — Progress Notes (Signed)
 Orthopedic Spine Surgery Office Note  Assessment: Patient is a 63 y.o. 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  Explained that myelopathy has a natural history of progressive decline in function and there is not any known conservative treatment to treat this condition.  Accordingly, recommended operative management in the form of C4/5 ACDF with a stand-alone cage.  Would plan to do a left-sided incision since that is where her prior incision was.  In order to minimize her risk of dysphagia and to decrease the surgical exposure, would plan to do a stand-alone cage.  With the stand-alone, would plan to use a brace for at least 6 weeks after surgery. - I told the patient and her daughter that I was able to see the MRI report which shows T2 cord signal change and central stenosis at C4/5.  However, the CD has a scratch on it so I was unable to view the images myself.  I told her that I would not perform surgery without being able to view the images.  We will tentatively plan for a C4/5 ACDF based on the report but if I need to change the plan, I will give the patient and her daughter a call to explain why the plan is changing after my review of the images -Patient will next be seen at date of surgery   The patient has signs and symptoms consistent with cervical myelopathy. The most common natural history of cervical myelopathy was explained to the patient. Given this course, operative management in the form of C4/5 anterior cervical discectomy and fusion was recommended to the patient. The risks, including but not limited to pseudarthrosis, dysphagia, hematoma, airway compromise, recurrent laryngeal nerve injury, esophageal perforation, durotomy, spinal cord injury, nerve root injury, persistent pain, adjacent segment disease, infection, bleeding, hardware failure, vascular injury, heart attack,  death, dvt/pe, fracture, and need for additional procedures were discussed with the patient. The benefit of surgery would be preventing progression of the myelopathy and not to reverse any myelopathic symptoms. Explained that the patient may get some relief of her radiating arm pain, but she may not get full relief of her pain with this surgery, especially any neck pain. The alternatives to surgical management would be continued monitoring, physical therapy, over-the-counter pain medications, ambulatory aids, and activity modification. I reiterated that these modalities would not change the natural history of the disease and that is why operative management has been recommended. All the patient's questions were answered to her and her daughter's satisfaction. After this discussion, the patient expressed understanding and elected to proceed with surgical intervention.     ___________________________________________________________________________   History:  Patient is a 63 y.o. female who presents today for cervical spine.  Patient has a history of MS and has been followed by neurology.  More recently she has developed instability and trouble with fine motor skills in her hands.  She has been using a cane to help with her unsteadiness with gait.  She is also developed headaches.  She has noticed pain that starts in her neck and radiates into the right upper extremity.  She feels a going along the lateral aspect of the shoulder and arm to the level of the elbow.  It sometimes radiates into the dorsal forearm as well.  She does not have any pain radiating into the left upper extremity.  She does have the symptoms for the last couple  of years, but they have gotten progressively worse more recently.  There was no trauma or injury that preceded the onset of the symptoms.  Her neurologist felt the symptoms were consistent with myelopathy and ordered an MRI scan.  The scan did show central stenosis at C4/5 so she  was referred to spine surgery for further recommendations.  She has a history of a ACDF in 2004.   Weakness: Yes, has weakness in multiple areas due to the MS.  Most of her weakness is felt in the legs Difficulty with fine motor skills (e.g., buttoning shirts, handwriting): Yes, has had difficulty with zipping up dresses, fastening bras, and has noticed her handwriting has been getting worse Symptoms of imbalance: Yes, has felt unsteady on her feet and has had to start using a cane Paresthesias and numbness: Yes, has numbness and paresthesias in her bilateral hands.  No other numbness or paresthesias Bowel or bladder incontinence: Yes, has about a 1 year history of urinary incontinence.  She describes an urgency with having to go and if she does not make it in time she will void.  No bowel incontinence. Saddle anesthesia: Denies  Treatments tried: PT, Tylenol , Aleve, steroid injections  Review of systems: Denies fevers and chills, night sweats, unexplained weight loss, history of cancer.  Has had pain that wakes her at night  Past medical history: HLD GI GERD IBS Migraines MS Diabetes (last A1c was 6.5 on 02/02/2023) Chronic pain Optic neuritis Sjogren's syndrome  Allergies: lidocaine, amitriptyline, bacitracin, atorvastatin, latex  Past surgical history:  C5-7 ACDF C-section Cholecystectomy  Social history: Denies use of nicotine product (smoking, vaping, patches, smokeless) Alcohol use: denies  Denies recreational drug use   Physical Exam:  BMI of 28.0  General: no acute distress, appears stated age Neurologic: alert, answering questions appropriately, following commands Respiratory: unlabored breathing on room air, symmetric chest rise Psychiatric: appropriate affect, normal cadence to speech Skin: well healed left sided transverse incision over the anterior neck  MSK (spine):  -Strength exam      Left  Right Grip strength                 5/5  5/5 Interosseus   5/5   5/5 Wrist extension  5/5  5/5 Wrist flexion   5/5  5/5 Elbow flexion   5/5  5/5 Deltoid    5/5  5/5  EHL    4/5  4/5 TA    5/5  5/5 GSC    5/5  5/5 Knee extension  5/5  5/5 Hip flexion   4/5  5/5  -Sensory exam    Sensation intact to light touch in L2-S1 nerve distributions of bilateral lower extremities  Sensation intact to light touch in C4-T1 nerve distributions of bilateral upper extremities  -Brachioradialis DTR: 2/4 on the left, 2/4 on the right -Biceps DTR: 3/4 on the left, 3/4 on the right -Triceps DTR: 2/4 on the left, 2/4 on the right -Achilles DTR: 2/4 on the left, 2/4 on the right -Patellar tendon DTR: 2/4 on the left, 2/4 on the right  -Spurling: negative bilaterally -Hoffman sign: positive on the left, negative on the right -Clonus: no beats bilaterally -Interosseous wasting: none seen -Grip and release test: positive -Romberg: positive -Gait: wide-based with cane -Imbalance with tandem gait: yes  Left shoulder exam: no pain through range of motion Right shoulder exam: no pain through range of motion, negative jobe, negative belly press, no weakness with external rotation with arm at side  Tinel's at wrist: negative bilaterally Phalen's at wrist: negative bilaterally Durkan's: negative bilaterally  Tinel's at elbow: negative bilaterally  Imaging: XRs of the cervical spine from 02/27/2024 were independently reviewed and interpreted, showing anterior cervical plate from W0-9. No lucency seen around the screws. Fusion mass appears to be spanning the former disc spaces. Disc height loss with anterior osteophyte formation at C4/5. No evidence of instability on flexion/extension views. No fracture or dislocation seen.    Patient name: Linda Beltran Patient MRN: [id number] Date of visit: 02/27/24   Per-operative Scores  mJOA: 10 VAS neck: 10/10 VAS arm: 8/10

## 2024-03-11 DIAGNOSIS — R112 Nausea with vomiting, unspecified: Secondary | ICD-10-CM | POA: Diagnosis not present

## 2024-03-11 DIAGNOSIS — R1084 Generalized abdominal pain: Secondary | ICD-10-CM | POA: Diagnosis not present

## 2024-03-11 DIAGNOSIS — E860 Dehydration: Secondary | ICD-10-CM | POA: Diagnosis not present

## 2024-03-13 DIAGNOSIS — M3500 Sicca syndrome, unspecified: Secondary | ICD-10-CM | POA: Diagnosis not present

## 2024-03-13 DIAGNOSIS — M792 Neuralgia and neuritis, unspecified: Secondary | ICD-10-CM | POA: Diagnosis not present

## 2024-03-13 DIAGNOSIS — G35 Multiple sclerosis: Secondary | ICD-10-CM | POA: Diagnosis not present

## 2024-03-13 DIAGNOSIS — G43709 Chronic migraine without aura, not intractable, without status migrainosus: Secondary | ICD-10-CM | POA: Diagnosis not present

## 2024-03-17 DIAGNOSIS — I10 Essential (primary) hypertension: Secondary | ICD-10-CM | POA: Diagnosis not present

## 2024-03-17 DIAGNOSIS — R748 Abnormal levels of other serum enzymes: Secondary | ICD-10-CM | POA: Diagnosis not present

## 2024-03-17 DIAGNOSIS — E1165 Type 2 diabetes mellitus with hyperglycemia: Secondary | ICD-10-CM | POA: Diagnosis not present

## 2024-03-17 DIAGNOSIS — G35 Multiple sclerosis: Secondary | ICD-10-CM | POA: Diagnosis not present

## 2024-03-17 DIAGNOSIS — R630 Anorexia: Secondary | ICD-10-CM | POA: Diagnosis not present

## 2024-03-24 NOTE — Progress Notes (Signed)
 Surgical Instructions   Your procedure is scheduled on Mar 31, 2024. Report to [facility] Main Entrance "A" at 8:40 A.M., then check in with the Admitting office. Any questions or running late day of surgery: call [phone]  Questions prior to your surgery date: call [phone], Monday-Friday, 8am-4pm. If you experience any cold or flu symptoms such as cough, fever, chills, shortness of breath, etc. between now and your scheduled surgery, please notify us  at the above number.     Remember:  Do not eat after midnight the night before your surgery  You may drink clear liquids until 8:40 the morning of your surgery.   Clear liquids allowed are: Water, Non-Citrus Juices (without pulp), Carbonated Beverages, Clear Tea (no milk, honey, etc.), Black Coffee Only (NO MILK, CREAM OR POWDERED CREAMER of any kind), and Gatorade. Patient Instructions  The night before surgery:  No food after midnight. ONLY clear liquids after midnight   The day of surgery (if you have diabetes): Drink ONE (1) 12 oz G2 given to you in your pre admission testing appointment by 8:40 the morning of surgery. Drink in one sitting. Do not sip.  This drink was given to you during your hospital  pre-op appointment visit.  Nothing else to drink after completing the  12 oz bottle of G2.         If you have questions, please contact your surgeon's office.    Take these medicines the morning of surgery with A SIP OF WATER  DULoxetine  (CYMBALTA )  metoprolol  succinate (TOPROL -XL ) pregabalin  (LYRICA )  rosuvastatin  (CRESTOR )  May take these medicines IF NEEDED:    One week prior to surgery, STOP taking any Aspirin (unless otherwise instructed by your surgeon) Aleve, Naproxen, Ibuprofen, Motrin, Advil, Goody's, BC's, all herbal medications, fish oil, and non-prescription vitamins.meloxicam (MOBIC)              WHAT DO I DO ABOUT MY DIABETES MEDICATION?   Do not take oral diabetes medicines (pills) the morning of  surgery.  Linda Beltran LAST DOSE:  The day of surgery, do not take other diabetes injectables, including Byetta (exenatide), Bydureon (exenatide ER), Victoza (liraglutide), or Trulicity (dulaglutide).  If your CBG is greater than 220 mg/dL, you may take  of your sliding scale (correction) dose of insulin .   HOW TO MANAGE YOUR DIABETES BEFORE AND AFTER SURGERY  Why is it important to control my blood sugar before and after surgery? Improving blood sugar levels before and after surgery helps healing and can limit problems. A way of improving blood sugar control is eating a healthy diet by:  Eating less sugar and carbohydrates  Increasing activity/exercise  Talking with your doctor about reaching your blood sugar goals High blood sugars (greater than 180 mg/dL) can raise your risk of infections and slow your recovery, so you will need to focus on controlling your diabetes during the weeks before surgery. Make sure that the doctor who takes care of your diabetes knows about your planned surgery including the date and location.  How do I manage my blood sugar before surgery? Check your blood sugar at least 4 times a day, starting 2 days before surgery, to make sure that the level is not too high or low.  Check your blood sugar the morning of your surgery when you wake up and every 2 hours until you get to the Short Stay unit.  If your blood sugar is less than 70 mg/dL, you will need to treat for low blood sugar:  Do not take insulin . Treat a low blood sugar (less than 70 mg/dL) with  cup of clear juice (cranberry or apple), 4 glucose tablets, OR glucose gel. Recheck blood sugar in 15 minutes after treatment (to make sure it is greater than 70 mg/dL). If your blood sugar is not greater than 70 mg/dL on recheck, call [phone] for further instructions. Report your blood sugar to the short stay nurse when you get to Short Stay.  If you are admitted to the hospital after surgery: Your blood  sugar will be checked by the staff and you will probably be given insulin  after surgery (instead of oral diabetes medicines) to make sure you have good blood sugar levels. The goal for blood sugar control after surgery is 80-180 mg/dL.         Do NOT Smoke (Tobacco/Vaping) for 24 hours prior to your procedure.  If you use a CPAP at night, you may bring your mask/headgear for your overnight stay.   You will be asked to remove any contacts, glasses, piercing's, hearing aid's, dentures/partials prior to surgery. Please bring cases for these items if needed.    Patients discharged the day of surgery will not be allowed to drive home, and someone needs to stay with them for 24 hours.  SURGICAL WAITING ROOM VISITATION Patients may have no more than 2 support people in the waiting area - these visitors may rotate.   Pre-op nurse will coordinate an appropriate time for 1 ADULT support person, who may not rotate, to accompany patient in pre-op.  Children under the age of 87 must have an adult with them who is not the patient and must remain in the main waiting area with an adult.  If the patient needs to stay at the hospital during part of their recovery, the visitor guidelines for inpatient rooms apply.  Please refer to the [facility] website for the visitor guidelines for any additional information.   If you received a COVID test during your pre-op visit  it is requested that you wear a mask when out in public, stay away from anyone that may not be feeling well and notify your surgeon if you develop symptoms. If you have been in contact with anyone that has tested positive in the last 10 days please notify you surgeon.      Pre-operative 5 CHG Bathing Instructions   You can play a key role in reducing the risk of infection after surgery. Your skin needs to be as free of germs as possible. You can reduce the number of germs on your skin by washing with CHG (chlorhexidine gluconate) soap before  surgery. CHG is an antiseptic soap that kills germs and continues to kill germs even after washing.   DO NOT use if you have an allergy to chlorhexidine/CHG or antibacterial soaps. If your skin becomes reddened or irritated, stop using the CHG and notify one of our RNs at [phone].   Please shower with the CHG soap starting 4 days before surgery using the following schedule:     Please keep in mind the following:  DO NOT shave, including legs and underarms, starting the day of your first shower.   You may shave your face at any point before/day of surgery.  Place clean sheets on your bed the day you start using CHG soap. Use a clean washcloth (not used since being washed) for each shower. DO NOT sleep with pets once you start using the CHG.   CHG Shower Instructions:  Wash your face and private area with normal soap. If you choose to wash your hair, wash first with your normal shampoo.  After you use shampoo/soap, rinse your hair and body thoroughly to remove shampoo/soap residue.  Turn the water OFF and apply about 3 tablespoons (45 ml) of CHG soap to a CLEAN washcloth.  Apply CHG soap ONLY FROM YOUR NECK DOWN TO YOUR TOES (washing for 3-5 minutes)  DO NOT use CHG soap on face, private areas, open wounds, or sores.  Pay special attention to the area where your surgery is being performed.  If you are having back surgery, having someone wash your back for you may be helpful. Wait 2 minutes after CHG soap is applied, then you may rinse off the CHG soap.  Pat dry with a clean towel  Put on clean clothes/pajamas   If you choose to wear lotion, please use ONLY the CHG-compatible lotions that are listed below.  Additional instructions for the day of surgery: DO NOT APPLY any lotions, deodorants, cologne, or perfumes.   Do not bring valuables to the hospital. [facility] is not responsible for any belongings/valuables. Do not wear nail polish, gel polish, artificial nails, or any other  type of covering on natural nails (fingers and toes) Do not wear jewelry or makeup Put on clean/comfortable clothes.  Please brush your teeth.  Ask your nurse before applying any prescription medications to the skin.     CHG Compatible Lotions   Aveeno Moisturizing lotion  Cetaphil Moisturizing Cream  Cetaphil Moisturizing Lotion  Clairol Herbal Essence Moisturizing Lotion, Dry Skin  Clairol Herbal Essence Moisturizing Lotion, Extra Dry Skin  Clairol Herbal Essence Moisturizing Lotion, Normal Skin  Curel Age Defying Therapeutic Moisturizing Lotion with Alpha Hydroxy  Curel Extreme Care Body Lotion  Curel Soothing Hands Moisturizing Hand Lotion  Curel Therapeutic Moisturizing Cream, Fragrance-Free  Curel Therapeutic Moisturizing Lotion, Fragrance-Free  Curel Therapeutic Moisturizing Lotion, Original Formula  Eucerin Daily Replenishing Lotion  Eucerin Dry Skin Therapy Plus Alpha Hydroxy Crme  Eucerin Dry Skin Therapy Plus Alpha Hydroxy Lotion  Eucerin Original Crme  Eucerin Original Lotion  Eucerin Plus Crme Eucerin Plus Lotion  Eucerin TriLipid Replenishing Lotion  Keri Anti-Bacterial Hand Lotion  Keri Deep Conditioning Original Lotion Dry Skin Formula Softly Scented  Keri Deep Conditioning Original Lotion, Fragrance Free Sensitive Skin Formula  Keri Lotion Fast Absorbing Fragrance Free Sensitive Skin Formula  Keri Lotion Fast Absorbing Softly Scented Dry Skin Formula  Keri Original Lotion  Keri Skin Renewal Lotion Keri Silky Smooth Lotion  Keri Silky Smooth Sensitive Skin Lotion  Nivea Body Creamy Conditioning Oil  Nivea Body Extra Enriched Lotion  Nivea Body Original Lotion  Nivea Body Sheer Moisturizing Lotion Nivea Crme  Nivea Skin Firming Lotion  NutraDerm 30 Skin Lotion  NutraDerm Skin Lotion  NutraDerm Therapeutic Skin Cream  NutraDerm Therapeutic Skin Lotion  ProShield Protective Hand Cream  Provon moisturizing lotion  Please read over the following fact  sheets that you were given.

## 2024-03-25 ENCOUNTER — Encounter (HOSPITAL_COMMUNITY): Payer: Self-pay

## 2024-03-25 ENCOUNTER — Encounter (HOSPITAL_COMMUNITY)
Admission: RE | Admit: 2024-03-25 | Discharge: 2024-03-25 | Disposition: A | Discharge status: Home / Self Care | Source: Ambulatory Visit | Attending: Orthopedic Surgery | Admitting: Orthopedic Surgery

## 2024-03-25 ENCOUNTER — Other Ambulatory Visit: Payer: Self-pay

## 2024-03-25 VITALS — BP 175/85 | HR 104 | Temp 97.7°F | Resp 17 | Ht 62.0 in | Wt 153.6 lb

## 2024-03-25 DIAGNOSIS — Z01812 Encounter for preprocedural laboratory examination: Secondary | ICD-10-CM | POA: Diagnosis not present

## 2024-03-25 DIAGNOSIS — G35 Multiple sclerosis: Secondary | ICD-10-CM | POA: Diagnosis not present

## 2024-03-25 DIAGNOSIS — E119 Type 2 diabetes mellitus without complications: Secondary | ICD-10-CM | POA: Insufficient documentation

## 2024-03-25 DIAGNOSIS — M79642 Pain in left hand: Secondary | ICD-10-CM | POA: Diagnosis not present

## 2024-03-25 DIAGNOSIS — I10 Essential (primary) hypertension: Secondary | ICD-10-CM | POA: Diagnosis not present

## 2024-03-25 DIAGNOSIS — R200 Anesthesia of skin: Secondary | ICD-10-CM | POA: Diagnosis not present

## 2024-03-25 DIAGNOSIS — Z79899 Other long term (current) drug therapy: Secondary | ICD-10-CM | POA: Diagnosis not present

## 2024-03-25 DIAGNOSIS — M79604 Pain in right leg: Secondary | ICD-10-CM | POA: Diagnosis not present

## 2024-03-25 DIAGNOSIS — R768 Other specified abnormal immunological findings in serum: Secondary | ICD-10-CM | POA: Diagnosis not present

## 2024-03-25 DIAGNOSIS — E785 Hyperlipidemia, unspecified: Secondary | ICD-10-CM | POA: Diagnosis not present

## 2024-03-25 DIAGNOSIS — M25539 Pain in unspecified wrist: Secondary | ICD-10-CM | POA: Diagnosis not present

## 2024-03-25 DIAGNOSIS — M1990 Unspecified osteoarthritis, unspecified site: Secondary | ICD-10-CM | POA: Diagnosis not present

## 2024-03-25 DIAGNOSIS — Z885 Allergy status to narcotic agent status: Secondary | ICD-10-CM | POA: Diagnosis not present

## 2024-03-25 DIAGNOSIS — M79641 Pain in right hand: Secondary | ICD-10-CM | POA: Diagnosis not present

## 2024-03-25 DIAGNOSIS — M8580 Other specified disorders of bone density and structure, unspecified site: Secondary | ICD-10-CM | POA: Diagnosis not present

## 2024-03-25 DIAGNOSIS — M25569 Pain in unspecified knee: Secondary | ICD-10-CM | POA: Diagnosis not present

## 2024-03-25 DIAGNOSIS — Z01818 Encounter for other preprocedural examination: Secondary | ICD-10-CM

## 2024-03-25 DIAGNOSIS — M79605 Pain in left leg: Secondary | ICD-10-CM | POA: Diagnosis not present

## 2024-03-25 HISTORY — DX: Essential (primary) hypertension: I10

## 2024-03-25 HISTORY — DX: Sjogren syndrome, unspecified: M35.00

## 2024-03-25 LAB — BASIC METABOLIC PANEL WITH GFR
Anion gap: 7 (ref 5–15)
BUN: 8 mg/dL (ref 8–23)
CO2: 27 mmol/L (ref 22–32)
Calcium: 9 mg/dL (ref 8.9–10.3)
Chloride: 103 mmol/L (ref 98–111)
Creatinine, Ser: 0.9 mg/dL (ref 0.44–1.00)
GFR, Estimated: >60 mL/min (ref >60)
Glucose, Bld: 102 mg/dL — ABNORMAL HIGH (ref 70–99)
Potassium: 4.2 mmol/L (ref 3.5–5.1)
Sodium: 137 mmol/L (ref 135–145)

## 2024-03-25 LAB — CBC
HCT: 40.8 % (ref 36.0–46.0)
Hemoglobin: 13.2 g/dL (ref 12.0–15.0)
MCH: 28 pg (ref 26.0–34.0)
MCHC: 32.4 g/dL (ref 30.0–36.0)
MCV: 86.4 fL (ref 80.0–100.0)
Platelets: 330 10*3/uL (ref 150–400)
RBC: 4.72 MIL/uL (ref 3.87–5.11)
RDW: 14.2 % (ref 11.5–15.5)
WBC: 7.5 10*3/uL (ref 4.0–10.5)
nRBC: 0 % (ref 0.0–0.2)

## 2024-03-25 LAB — SURGICAL PCR SCREEN
MRSA, PCR: NEGATIVE
Staphylococcus aureus: NEGATIVE

## 2024-03-25 LAB — GLUCOSE, CAPILLARY: Glucose-Capillary: 109 mg/dL — ABNORMAL HIGH (ref 70–99)

## 2024-03-25 LAB — HEMOGLOBIN A1C
Hgb A1c MFr Bld: 5.7 % — ABNORMAL HIGH (ref 4.8–5.6)
Mean Plasma Glucose: 116.89 mg/dL

## 2024-03-25 NOTE — Progress Notes (Signed)
 PCP - Marleta Simmer, FNP Cardiologist - denies  PPM/ICD - denies   Chest x-ray - 02/06/24 EKG - 02/06/24 Stress Test - denies ECHO - denies Cardiac Cath - denies  Sleep Study - denies   Fasting Blood Sugar - 90-110 Checks Blood Sugar every other day  Last dose of GLP1 agonist-  03/23/24 GLP1 instructions: Pt knows not to take any doses after 5/19  ASA/Blood Thinner Instructions: n/a   ERAS Protcol - clears until 0840 PRE-SURGERY G2- given  COVID TEST- n/a   Anesthesia review: no  Patient denies shortness of breath, fever, cough and chest pain at PAT appointment   All instructions explained to the patient, with a verbal understanding of the material. Patient agrees to go over the instructions while at home for a better understanding.  The opportunity to ask questions was provided.

## 2024-03-25 NOTE — Progress Notes (Signed)
 Surgical Instructions   Your procedure is scheduled on Mar 31, 2024. Report to [facility] Main Entrance "A" at 9:40 A.M., then check in with the Admitting office. Any questions or running late day of surgery: call [phone]  Questions prior to your surgery date: call [phone], Monday-Friday, 8am-4pm. If you experience any cold or flu symptoms such as cough, fever, chills, shortness of breath, etc. between now and your scheduled surgery, please notify us  at the above number.     Remember:  Do not eat after midnight the night before your surgery  You may drink clear liquids until 8:40 the morning of your surgery.   Clear liquids allowed are: Water, Non-Citrus Juices (without pulp), Carbonated Beverages, Clear Tea (no milk, honey, etc.), Black Coffee Only (NO MILK, CREAM OR POWDERED CREAMER of any kind), and Gatorade. Patient Instructions  The night before surgery:  No food after midnight. ONLY clear liquids after midnight   The day of surgery (if you have diabetes): Drink ONE (1) 12 oz G2 given to you in your pre admission testing appointment by 8:40 the morning of surgery. Drink in one sitting. Do not sip.  This drink was given to you during your hospital  pre-op appointment visit.  Nothing else to drink after completing the  12 oz bottle of G2.         If you have questions, please contact your surgeon's office.    Take these medicines the morning of surgery with A SIP OF WATER  DULoxetine  (CYMBALTA )  metoprolol  succinate (TOPROL -XL ) pregabalin  (LYRICA )  rosuvastatin  (CRESTOR )  May take these medicines IF NEEDED:    One week prior to surgery, STOP taking any Aspirin (unless otherwise instructed by your surgeon) Aleve, Naproxen, Ibuprofen, Motrin, Advil, Goody's, BC's, all herbal medications, fish oil, and non-prescription vitamins.meloxicam (MOBIC)              WHAT DO I DO ABOUT MY DIABETES MEDICATION?  STOP taking OZEMPIC 7 days prior to surgery. Last dose  5/19   HOW TO MANAGE YOUR DIABETES BEFORE AND AFTER SURGERY  Why is it important to control my blood sugar before and after surgery? Improving blood sugar levels before and after surgery helps healing and can limit problems. A way of improving blood sugar control is eating a healthy diet by:  Eating less sugar and carbohydrates  Increasing activity/exercise  Talking with your doctor about reaching your blood sugar goals High blood sugars (greater than 180 mg/dL) can raise your risk of infections and slow your recovery, so you will need to focus on controlling your diabetes during the weeks before surgery. Make sure that the doctor who takes care of your diabetes knows about your planned surgery including the date and location.  How do I manage my blood sugar before surgery? Check your blood sugar at least 4 times a day, starting 2 days before surgery, to make sure that the level is not too high or low.  Check your blood sugar the morning of your surgery when you wake up and every 2 hours until you get to the Short Stay unit.  If your blood sugar is less than 70 mg/dL, you will need to treat for low blood sugar: Do not take insulin . Treat a low blood sugar (less than 70 mg/dL) with  cup of clear juice (cranberry or apple), 4 glucose tablets, OR glucose gel. Recheck blood sugar in 15 minutes after treatment (to make sure it is greater than 70 mg/dL). If your blood  sugar is not greater than 70 mg/dL on recheck, call [phone] for further instructions. Report your blood sugar to the short stay nurse when you get to Short Stay.  If you are admitted to the hospital after surgery: Your blood sugar will be checked by the staff and you will probably be given insulin  after surgery (instead of oral diabetes medicines) to make sure you have good blood sugar levels. The goal for blood sugar control after surgery is 80-180 mg/dL.         Do NOT Smoke (Tobacco/Vaping) for 24 hours prior to your  procedure.  If you use a CPAP at night, you may bring your mask/headgear for your overnight stay.   You will be asked to remove any contacts, glasses, piercing's, hearing aid's, dentures/partials prior to surgery. Please bring cases for these items if needed.    Patients discharged the day of surgery will not be allowed to drive home, and someone needs to stay with them for 24 hours.  SURGICAL WAITING ROOM VISITATION Patients may have no more than 2 support people in the waiting area - these visitors may rotate.   Pre-op nurse will coordinate an appropriate time for 1 ADULT support person, who may not rotate, to accompany patient in pre-op.  Children under the age of 74 must have an adult with them who is not the patient and must remain in the main waiting area with an adult.  If the patient needs to stay at the hospital during part of their recovery, the visitor guidelines for inpatient rooms apply.  Please refer to the [facility] website for the visitor guidelines for any additional information.   If you received a COVID test during your pre-op visit  it is requested that you wear a mask when out in public, stay away from anyone that may not be feeling well and notify your surgeon if you develop symptoms. If you have been in contact with anyone that has tested positive in the last 10 days please notify you surgeon.      Pre-operative 5 CHG Bathing Instructions   You can play a key role in reducing the risk of infection after surgery. Your skin needs to be as free of germs as possible. You can reduce the number of germs on your skin by washing with CHG (chlorhexidine gluconate) soap before surgery. CHG is an antiseptic soap that kills germs and continues to kill germs even after washing.   DO NOT use if you have an allergy to chlorhexidine/CHG or antibacterial soaps. If your skin becomes reddened or irritated, stop using the CHG and notify one of our RNs at [phone].   Please  shower with the CHG soap starting 4 days before surgery using the following schedule:     Please keep in mind the following:  DO NOT shave, including legs and underarms, starting the day of your first shower.   You may shave your face at any point before/day of surgery.  Place clean sheets on your bed the day you start using CHG soap. Use a clean washcloth (not used since being washed) for each shower. DO NOT sleep with pets once you start using the CHG.   CHG Shower Instructions:  Wash your face and private area with normal soap. If you choose to wash your hair, wash first with your normal shampoo.  After you use shampoo/soap, rinse your hair and body thoroughly to remove shampoo/soap residue.  Turn the water OFF and apply about 3 tablespoons (  45 ml) of CHG soap to a CLEAN washcloth.  Apply CHG soap ONLY FROM YOUR NECK DOWN TO YOUR TOES (washing for 3-5 minutes)  DO NOT use CHG soap on face, private areas, open wounds, or sores.  Pay special attention to the area where your surgery is being performed.  If you are having back surgery, having someone wash your back for you may be helpful. Wait 2 minutes after CHG soap is applied, then you may rinse off the CHG soap.  Pat dry with a clean towel  Put on clean clothes/pajamas   If you choose to wear lotion, please use ONLY the CHG-compatible lotions that are listed below.  Additional instructions for the day of surgery: DO NOT APPLY any lotions, deodorants, cologne, or perfumes.   Do not bring valuables to the hospital. [facility] is not responsible for any belongings/valuables. Do not wear nail polish, gel polish, artificial nails, or any other type of covering on natural nails (fingers and toes) Do not wear jewelry or makeup Put on clean/comfortable clothes.  Please brush your teeth.  Ask your nurse before applying any prescription medications to the skin.     CHG Compatible Lotions   Aveeno Moisturizing lotion  Cetaphil  Moisturizing Cream  Cetaphil Moisturizing Lotion  Clairol Herbal Essence Moisturizing Lotion, Dry Skin  Clairol Herbal Essence Moisturizing Lotion, Extra Dry Skin  Clairol Herbal Essence Moisturizing Lotion, Normal Skin  Curel Age Defying Therapeutic Moisturizing Lotion with Alpha Hydroxy  Curel Extreme Care Body Lotion  Curel Soothing Hands Moisturizing Hand Lotion  Curel Therapeutic Moisturizing Cream, Fragrance-Free  Curel Therapeutic Moisturizing Lotion, Fragrance-Free  Curel Therapeutic Moisturizing Lotion, Original Formula  Eucerin Daily Replenishing Lotion  Eucerin Dry Skin Therapy Plus Alpha Hydroxy Crme  Eucerin Dry Skin Therapy Plus Alpha Hydroxy Lotion  Eucerin Original Crme  Eucerin Original Lotion  Eucerin Plus Crme Eucerin Plus Lotion  Eucerin TriLipid Replenishing Lotion  Keri Anti-Bacterial Hand Lotion  Keri Deep Conditioning Original Lotion Dry Skin Formula Softly Scented  Keri Deep Conditioning Original Lotion, Fragrance Free Sensitive Skin Formula  Keri Lotion Fast Absorbing Fragrance Free Sensitive Skin Formula  Keri Lotion Fast Absorbing Softly Scented Dry Skin Formula  Keri Original Lotion  Keri Skin Renewal Lotion Keri Silky Smooth Lotion  Keri Silky Smooth Sensitive Skin Lotion  Nivea Body Creamy Conditioning Oil  Nivea Body Extra Enriched Lotion  Nivea Body Original Lotion  Nivea Body Sheer Moisturizing Lotion Nivea Crme  Nivea Skin Firming Lotion  NutraDerm 30 Skin Lotion  NutraDerm Skin Lotion  NutraDerm Therapeutic Skin Cream  NutraDerm Therapeutic Skin Lotion  ProShield Protective Hand Cream  Provon moisturizing lotion  Please read over the following fact sheets that you were given.

## 2024-03-30 NOTE — H&P (Signed)
 Orthopedic Spine Surgery H&P Note  Assessment: Patient is a 63 y.o. female with cervical myelopathy   Plan: -Out of bed as tolerated, activity as tolerated, no brace -Covered the risks of surgery one more time with the patient and patient elected to proceed with planned surgery -Written consent verified -Hold anticoagulation in anticipation of surgery -Ancef and TXA on all to OR -NPO for procedure -Site marked -To OR when ready   The risks covered this morning included but were not limited to: pseudarthrosis, dysphagia, hematoma, airway compromise, recurrent laryngeal nerve injury, esophageal perforation, durotomy, spinal cord injury, nerve root injury, persistent pain, adjacent segment disease, infection, bleeding, hardware failure, vascular injury, heart attack, death, dvt/pe, fracture, and need for additional procedures. After this conversation, patient's questions were answered to her satisfaction and she elected to proceed with surgery.  ___________________________________________________________________________  Chief Complaint: instability and difficulty with fine motor skills in the hands  History: Patient is 63 y.o. female who has been previously seen in the office for unsteadiness with gait and difficulty with fine motor tasks with her hands. Work up was consistent with cervical myelopathy. Given the nature of myelopathy, which involves stepwise decline in function, surgery was recommended at our last office visit. The patient presents today with no changes in her symptoms since the last office visit. See previous office note for further details.    Review of systems: General: denies fevers and chills, myalgias Neurologic: denies recent changes in vision, slurred speech Abdomen: denies nausea, vomiting, hematemesis Respiratory: denies cough, shortness of breath  Past medical history: HLD GI GERD IBS Migraines MS Diabetes (last A1c was 5.7 on 03/25/2024) Chronic  pain Optic neuritis Sjogren's syndrome   Allergies: lidocaine, amitriptyline, bacitracin, atorvastatin, latex   Past surgical history:  C5-7 ACDF C-section Cholecystectomy   Social history: Denies use of nicotine product (smoking, vaping, patches, smokeless) Alcohol use: denies  Denies recreational drug use  Family history: -reviewed and not pertinent to cervical myelopathy   Physical Exam:  General: no acute distress, appears stated age Neurologic: alert, answering questions appropriately, following commands Cardiovascular: regular rate, no cyanosis Respiratory: unlabored breathing on room air, symmetric chest rise Psychiatric: appropriate affect, normal cadence to speech   MSK (spine):  -Strength exam      Left  Right Grip strength                5/5  5/5 Interosseus   5/5   5/5 Wrist extension  5/5  5/5 Wrist flexion   5/5  5/5 Elbow extension  5/5  5/5 Elbow flexion   5/5  5/5 Deltoid    5/5  5/5  EHL    4/5  4/5 TA    5/5  5/5 GSC    5/5  5/5 Knee extension  5/5  5/5 Knee flexion   5/5  5/5 Hip flexion   4/5  5/5  -Sensory exam    Sensation intact to light touch in L2-S1 nerve distributions of bilateral lower extremities  Sensation intact to light touch in C4-T1 nerve distributions of bilateral upper extremities   Patient name: Linda Beltran Patient MRN: [id number] Date: 03/31/2024

## 2024-03-31 ENCOUNTER — Other Ambulatory Visit: Payer: Self-pay

## 2024-03-31 ENCOUNTER — Encounter (HOSPITAL_COMMUNITY)
Admission: RE | Disposition: A | Discharge status: Home / Self Care | Payer: Self-pay | Source: Home / Self Care | Attending: Orthopedic Surgery

## 2024-03-31 ENCOUNTER — Ambulatory Visit (HOSPITAL_COMMUNITY)

## 2024-03-31 ENCOUNTER — Encounter (HOSPITAL_COMMUNITY): Payer: Self-pay | Admitting: Orthopedic Surgery

## 2024-03-31 ENCOUNTER — Observation Stay (HOSPITAL_COMMUNITY)
Admission: RE | Admit: 2024-03-31 | Discharge: 2024-04-01 | Disposition: A | Discharge status: Home / Self Care | Attending: Orthopedic Surgery | Admitting: Orthopedic Surgery

## 2024-03-31 DIAGNOSIS — M4712 Other spondylosis with myelopathy, cervical region: Secondary | ICD-10-CM | POA: Diagnosis not present

## 2024-03-31 DIAGNOSIS — Z981 Arthrodesis status: Secondary | ICD-10-CM | POA: Diagnosis not present

## 2024-03-31 DIAGNOSIS — M2578 Osteophyte, vertebrae: Secondary | ICD-10-CM | POA: Diagnosis not present

## 2024-03-31 DIAGNOSIS — G959 Disease of spinal cord, unspecified: Secondary | ICD-10-CM | POA: Diagnosis not present

## 2024-03-31 DIAGNOSIS — I10 Essential (primary) hypertension: Secondary | ICD-10-CM | POA: Diagnosis not present

## 2024-03-31 DIAGNOSIS — E119 Type 2 diabetes mellitus without complications: Secondary | ICD-10-CM | POA: Diagnosis not present

## 2024-03-31 DIAGNOSIS — Z01818 Encounter for other preprocedural examination: Principal | ICD-10-CM

## 2024-03-31 HISTORY — PX: ANTERIOR CERVICAL DECOMP/DISCECTOMY FUSION: SHX1161

## 2024-03-31 LAB — GLUCOSE, CAPILLARY
Glucose-Capillary: 86 mg/dL (ref 70–99)
Glucose-Capillary: 115 mg/dL — ABNORMAL HIGH (ref 70–99)
Glucose-Capillary: 159 mg/dL — ABNORMAL HIGH (ref 70–99)

## 2024-03-31 SURGERY — ANTERIOR CERVICAL DECOMPRESSION/DISCECTOMY FUSION 1 LEVEL
Anesthesia: General

## 2024-03-31 MED ORDER — POVIDONE-IODINE 10 % EX OINT
TOPICAL_OINTMENT | CUTANEOUS | Status: AC
  Filled 2024-03-31: qty 28.35

## 2024-03-31 MED ORDER — HYDROMORPHONE HCL 1 MG/ML IJ SOLN
INTRAMUSCULAR | Status: DC | PRN
  Administered 2024-03-31 (×2): .5 mg via INTRAVENOUS

## 2024-03-31 MED ORDER — LACTATED RINGERS IV SOLN: INTRAVENOUS | Status: DC

## 2024-03-31 MED ORDER — ROSUVASTATIN CALCIUM 5 MG PO TABS
5.0000 mg | ORAL_TABLET | ORAL | Status: DC
  Administered 2024-04-01: 5 mg via ORAL
  Filled 2024-03-31: qty 1

## 2024-03-31 MED ORDER — PHENYLEPHRINE HCL-NACL 20-0.9 MG/250ML-% IV SOLN
INTRAVENOUS | Status: DC | PRN
  Administered 2024-03-31: 25 ug/min via INTRAVENOUS

## 2024-03-31 MED ORDER — PREGABALIN 75 MG PO CAPS
150.0000 mg | ORAL_CAPSULE | Freq: Three times a day (TID) | ORAL | Status: DC
  Administered 2024-03-31 – 2024-04-01 (×2): 150 mg via ORAL
  Filled 2024-03-31 (×2): qty 2

## 2024-03-31 MED ORDER — ALBUMIN HUMAN 5 % IV SOLN
INTRAVENOUS | Status: DC | PRN
Start: 2024-03-31 — End: 2024-03-31

## 2024-03-31 MED ORDER — MIDAZOLAM HCL 2 MG/2ML IJ SOLN
INTRAMUSCULAR | Status: AC
Start: 2024-03-31 — End: ?
  Filled 2024-03-31: qty 2

## 2024-03-31 MED ORDER — ROCURONIUM BROMIDE 10 MG/ML (PF) SYRINGE
PREFILLED_SYRINGE | INTRAVENOUS | Status: AC
  Filled 2024-03-31: qty 10

## 2024-03-31 MED ORDER — PROPOFOL 500 MG/50ML IV EMUL
INTRAVENOUS | Status: DC | PRN
  Administered 2024-03-31 (×2): 100 ug/kg/min via INTRAVENOUS

## 2024-03-31 MED ORDER — DEXAMETHASONE SODIUM PHOSPHATE 10 MG/ML IJ SOLN
INTRAMUSCULAR | Status: DC | PRN
  Administered 2024-03-31: 10 mg via INTRAVENOUS

## 2024-03-31 MED ORDER — PROPOFOL 10 MG/ML IV BOLUS
INTRAVENOUS | Status: DC | PRN
  Administered 2024-03-31: 40 mg via INTRAVENOUS
  Administered 2024-03-31: 120 mg via INTRAVENOUS

## 2024-03-31 MED ORDER — ONDANSETRON HCL 4 MG/2ML IJ SOLN: 4.0000 mg | Freq: Four times a day (QID) | INTRAMUSCULAR | Status: DC | PRN

## 2024-03-31 MED ORDER — INSULIN ASPART 100 UNIT/ML IJ SOLN: 4.0000 [IU] | Freq: Three times a day (TID) | INTRAMUSCULAR | Status: DC

## 2024-03-31 MED ORDER — MIDAZOLAM HCL 2 MG/2ML IJ SOLN
INTRAMUSCULAR | Status: DC | PRN
  Administered 2024-03-31: 2 mg via INTRAVENOUS

## 2024-03-31 MED ORDER — SENNA 8.6 MG PO TABS
1.0000 | ORAL_TABLET | Freq: Two times a day (BID) | ORAL | Status: DC
  Administered 2024-03-31: 8.6 mg via ORAL
  Filled 2024-03-31: qty 1

## 2024-03-31 MED ORDER — OXYCODONE HCL 5 MG PO TABS
5.0000 mg | ORAL_TABLET | ORAL | Status: DC | PRN
  Administered 2024-03-31: 10 mg via ORAL
  Administered 2024-04-01: 5 mg via ORAL
  Administered 2024-04-01 (×2): 10 mg via ORAL
  Filled 2024-03-31 (×4): qty 2

## 2024-03-31 MED ORDER — SODIUM CHLORIDE 0.9 % IV SOLN
0.1500 ug/kg/min | INTRAVENOUS | Status: DC
  Administered 2024-03-31: .15 ug/kg/min via INTRAVENOUS
  Filled 2024-03-31 (×2): qty 2000

## 2024-03-31 MED ORDER — ONDANSETRON HCL 4 MG PO TABS: 4.0000 mg | ORAL_TABLET | Freq: Four times a day (QID) | ORAL | Status: DC | PRN

## 2024-03-31 MED ORDER — INSULIN ASPART 100 UNIT/ML IJ SOLN: 0.0000 [IU] | Freq: Three times a day (TID) | INTRAMUSCULAR | Status: DC

## 2024-03-31 MED ORDER — ROCURONIUM BROMIDE 10 MG/ML (PF) SYRINGE
PREFILLED_SYRINGE | INTRAVENOUS | Status: DC | PRN
  Administered 2024-03-31: 40 mg via INTRAVENOUS

## 2024-03-31 MED ORDER — SURGIFLO WITH THROMBIN (HEMOSTATIC MATRIX KIT) OPTIME
TOPICAL | Status: DC | PRN
  Administered 2024-03-31: 1 via TOPICAL

## 2024-03-31 MED ORDER — SODIUM CHLORIDE 0.9 % IV SOLN
0.1500 ug/kg/min | INTRAVENOUS | Status: DC
  Filled 2024-03-31: qty 2000

## 2024-03-31 MED ORDER — HEMOSTATIC AGENTS (NO CHARGE) OPTIME
TOPICAL | Status: DC | PRN
  Administered 2024-03-31: 1 via TOPICAL

## 2024-03-31 MED ORDER — POLYETHYLENE GLYCOL 3350 17 G PO PACK
17.0000 g | PACK | Freq: Every day | ORAL | Status: DC
  Filled 2024-03-31: qty 1

## 2024-03-31 MED ORDER — METOPROLOL SUCCINATE ER 50 MG PO TB24
50.0000 mg | ORAL_TABLET | Freq: Every day | ORAL | Status: DC
  Administered 2024-04-01: 50 mg via ORAL
  Filled 2024-03-31: qty 1

## 2024-03-31 MED ORDER — CHLORHEXIDINE GLUCONATE 0.12 % MT SOLN
15.0000 mL | Freq: Once | OROMUCOSAL | Status: AC
  Administered 2024-03-31: 15 mL via OROMUCOSAL
  Filled 2024-03-31: qty 15

## 2024-03-31 MED ORDER — LABETALOL HCL 5 MG/ML IV SOLN
10.0000 mg | INTRAVENOUS | Status: DC | PRN
  Administered 2024-03-31: 10 mg via INTRAVENOUS

## 2024-03-31 MED ORDER — ONDANSETRON HCL 4 MG/2ML IJ SOLN
INTRAMUSCULAR | Status: AC
  Filled 2024-03-31: qty 2

## 2024-03-31 MED ORDER — ORAL CARE MOUTH RINSE: 15.0000 mL | Freq: Once | OROMUCOSAL | Status: AC

## 2024-03-31 MED ORDER — INSULIN ASPART 100 UNIT/ML IJ SOLN: 0.0000 [IU] | INTRAMUSCULAR | Status: DC | PRN

## 2024-03-31 MED ORDER — POVIDONE-IODINE 10 % EX SWAB
2.0000 | Freq: Once | CUTANEOUS | Status: AC
  Administered 2024-03-31: 2 via TOPICAL

## 2024-03-31 MED ORDER — CEFAZOLIN SODIUM-DEXTROSE 2-4 GM/100ML-% IV SOLN
2.0000 g | INTRAVENOUS | Status: AC
  Administered 2024-03-31 (×2): 2 g via INTRAVENOUS
  Filled 2024-03-31: qty 100

## 2024-03-31 MED ORDER — LABETALOL HCL 5 MG/ML IV SOLN
INTRAVENOUS | Status: AC
  Filled 2024-03-31: qty 4

## 2024-03-31 MED ORDER — HYDROMORPHONE HCL 1 MG/ML IJ SOLN: 0.5000 mg | INTRAMUSCULAR | Status: DC | PRN

## 2024-03-31 MED ORDER — FENTANYL CITRATE (PF) 250 MCG/5ML IJ SOLN
INTRAMUSCULAR | Status: DC | PRN
  Administered 2024-03-31: 100 ug via INTRAVENOUS
  Administered 2024-03-31 (×3): 50 ug via INTRAVENOUS

## 2024-03-31 MED ORDER — DULOXETINE HCL 60 MG PO CPEP
60.0000 mg | ORAL_CAPSULE | Freq: Every day | ORAL | Status: DC
  Administered 2024-04-01: 60 mg via ORAL
  Filled 2024-03-31: qty 1

## 2024-03-31 MED ORDER — 0.9 % SODIUM CHLORIDE (POUR BTL) OPTIME
TOPICAL | Status: DC | PRN
  Administered 2024-03-31: 1000 mL

## 2024-03-31 MED ORDER — SUGAMMADEX SODIUM 200 MG/2ML IV SOLN
INTRAVENOUS | Status: DC | PRN
  Administered 2024-03-31: 200 mg via INTRAVENOUS

## 2024-03-31 MED ORDER — DEXAMETHASONE SODIUM PHOSPHATE 10 MG/ML IJ SOLN
10.0000 mg | Freq: Once | INTRAMUSCULAR | Status: DC
  Filled 2024-03-31: qty 1

## 2024-03-31 MED ORDER — RIMEGEPANT SULFATE 75 MG PO TBDP: 75.0000 mg | ORAL_TABLET | Freq: Every day | ORAL | Status: DC | PRN

## 2024-03-31 MED ORDER — TRANEXAMIC ACID-NACL 1000-0.7 MG/100ML-% IV SOLN
1000.0000 mg | Freq: Once | INTRAVENOUS | Status: AC
  Administered 2024-03-31: 1000 mg via INTRAVENOUS
  Filled 2024-03-31: qty 100

## 2024-03-31 MED ORDER — MENTHOL 3 MG MT LOZG: 1.0000 | LOZENGE | OROMUCOSAL | Status: DC | PRN

## 2024-03-31 MED ORDER — CEFAZOLIN SODIUM-DEXTROSE 2-4 GM/100ML-% IV SOLN
2.0000 g | Freq: Four times a day (QID) | INTRAVENOUS | Status: AC
  Administered 2024-03-31 – 2024-04-01 (×2): 2 g via INTRAVENOUS
  Filled 2024-03-31 (×2): qty 100

## 2024-03-31 MED ORDER — ONDANSETRON HCL 4 MG/2ML IJ SOLN
INTRAMUSCULAR | Status: DC | PRN
  Administered 2024-03-31 (×2): 4 mg via INTRAVENOUS

## 2024-03-31 MED ORDER — INSULIN ASPART 100 UNIT/ML IJ SOLN: 0.0000 [IU] | Freq: Every day | INTRAMUSCULAR | Status: DC

## 2024-03-31 MED ORDER — PROPOFOL 10 MG/ML IV BOLUS
INTRAVENOUS | Status: AC
  Filled 2024-03-31: qty 20

## 2024-03-31 MED ORDER — TRANEXAMIC ACID-NACL 1000-0.7 MG/100ML-% IV SOLN
1000.0000 mg | INTRAVENOUS | Status: AC
  Administered 2024-03-31: 1000 mg via INTRAVENOUS
  Filled 2024-03-31: qty 100

## 2024-03-31 MED ORDER — HYDROMORPHONE HCL 1 MG/ML IJ SOLN
INTRAMUSCULAR | Status: AC
Start: 2024-03-31 — End: ?
  Filled 2024-03-31: qty 0.5

## 2024-03-31 MED ORDER — FENTANYL CITRATE (PF) 250 MCG/5ML IJ SOLN
INTRAMUSCULAR | Status: AC
  Filled 2024-03-31: qty 5

## 2024-03-31 MED ORDER — IRBESARTAN 150 MG PO TABS
150.0000 mg | ORAL_TABLET | Freq: Every day | ORAL | Status: DC
  Administered 2024-04-01: 150 mg via ORAL
  Filled 2024-03-31: qty 1

## 2024-03-31 MED ORDER — HYDROMORPHONE HCL 1 MG/ML IJ SOLN
INTRAMUSCULAR | Status: AC
  Filled 2024-03-31: qty 0.5

## 2024-03-31 SURGICAL SUPPLY — 70 items
ALCOHOL 70% 16 OZ (MISCELLANEOUS) ×1 IMPLANT
BAG COUNTER SPONGE SURGICOUNT (BAG) ×1 IMPLANT
BAND RUBBER #18 3X1/16 STRL (MISCELLANEOUS) ×2 IMPLANT
BENZOIN TINCTURE PRP APPL 2/3 (GAUZE/BANDAGES/DRESSINGS) IMPLANT
BLADE CLIPPER SURG (BLADE) IMPLANT
BONE FIBERS PLIAFX 5ML (Bone Implant) ×1 IMPLANT
BUR MATCHSTICK NEURO 3.0 LAGG (BURR) ×1 IMPLANT
CABLE BIPOLOR RESECTION CORD (MISCELLANEOUS) ×1 IMPLANT
CANISTER SUCTION 3000ML PPV (SUCTIONS) ×1 IMPLANT
CLSR STERI-STRIP ANTIMIC 1/2X4 (GAUZE/BANDAGES/DRESSINGS) ×1 IMPLANT
COVER BACK TABLE 60X90IN (DRAPES) IMPLANT
COVER MAYO STAND STRL (DRAPES) ×2 IMPLANT
COVER SURGICAL LIGHT HANDLE (MISCELLANEOUS) ×2 IMPLANT
DRAIN PENROSE 12X.25 LTX STRL (MISCELLANEOUS) IMPLANT
DRAPE C-ARM 42X72 X-RAY (DRAPES) ×1 IMPLANT
DRAPE LAPAROTOMY 100X72X124 (DRAPES) ×1 IMPLANT
DRAPE MICROSCOPE LEICA (MISCELLANEOUS) ×1 IMPLANT
DRAPE MICROSCOPE NONGLARE (MISCELLANEOUS) ×1 IMPLANT
DRAPE POUCH INSTRU U-SHP 10X18 (DRAPES) ×1 IMPLANT
DRAPE SURG 17X11 SM STRL (DRAPES) ×4 IMPLANT
DRAPE SURG 17X23 STRL (DRAPES) ×1 IMPLANT
DRAPE U-SHAPE 47X51 STRL (DRAPES) ×1 IMPLANT
DRAPE UTILITY W/TAPE 26X15 (DRAPES) IMPLANT
DRAPE UTILITY XL STRL (DRAPES) ×1 IMPLANT
DRSG OPSITE POSTOP 3X4 (GAUZE/BANDAGES/DRESSINGS) ×1 IMPLANT
DURAPREP 26ML APPLICATOR (WOUND CARE) ×1 IMPLANT
ELECT COATED BLADE 2.86 ST (ELECTRODE) ×1 IMPLANT
ELECT NVM5 SURFACE MEP/EMG (ELECTRODE) IMPLANT
ELECTRODE BLADE INSULATED 4IN (ELECTROSURGICAL) ×1 IMPLANT
ELECTRODE REM PT RTRN 9FT ADLT (ELECTROSURGICAL) ×1 IMPLANT
GAUZE SPONGE 4X4 12PLY STRL (GAUZE/BANDAGES/DRESSINGS) IMPLANT
GLOVE BIO SURGEON STRL SZ7.5 (GLOVE) ×2 IMPLANT
GLOVE INDICATOR 7.5 STRL GRN (GLOVE) ×1 IMPLANT
GOWN STRL REUS W/ TWL LRG LVL3 (GOWN DISPOSABLE) ×1 IMPLANT
GOWN STRL SURGICAL XL XLNG (GOWN DISPOSABLE) ×1 IMPLANT
GRAFT BNE FBR PLIAFX PRIME 5 (Bone Implant) IMPLANT
KIT BASIN OR (CUSTOM PROCEDURE TRAY) ×1 IMPLANT
KIT TURNOVER KIT B (KITS) ×1 IMPLANT
MODULE EMG NDL SSEP NVM5 (NEUROSURGERY SUPPLIES) IMPLANT
MODULE EMG NEEDLE SSEP NVM5 (NEUROSURGERY SUPPLIES) ×1 IMPLANT
NDL HYPO 22X1.5 SAFETY MO (MISCELLANEOUS) ×1 IMPLANT
NDL SPNL 18GX3.5 QUINCKE PK (NEEDLE) ×1 IMPLANT
NEEDLE HYPO 22X1.5 SAFETY MO (MISCELLANEOUS) ×1 IMPLANT
NEEDLE SPNL 18GX3.5 QUINCKE PK (NEEDLE) ×1 IMPLANT
NS IRRIG 1000ML POUR BTL (IV SOLUTION) ×1 IMPLANT
PACK ORTHO CERVICAL (CUSTOM PROCEDURE TRAY) ×1 IMPLANT
PACK UNIVERSAL I (CUSTOM PROCEDURE TRAY) ×1 IMPLANT
PAD ARMBOARD POSITIONER FOAM (MISCELLANEOUS) ×2 IMPLANT
PATTIES SURGICAL .5 X3 (DISPOSABLE) IMPLANT
PENCIL BUTTON HOLSTER BLD 10FT (ELECTRODE) ×1 IMPLANT
POSITIONER HEAD DONUT 9IN (MISCELLANEOUS) ×1 IMPLANT
RESTRAINT LIMB HOLDER UNIV (RESTRAINTS) ×1 IMPLANT
SCREW SELF DRILL 3.6MM14MM (Screw) IMPLANT
SPACER LORD COAL 14X7X12 7D (Spacer) IMPLANT
SPONGE INTESTINAL PEANUT (DISPOSABLE) ×1 IMPLANT
SPONGE SURGIFOAM ABS GEL SZ50 (HEMOSTASIS) ×1 IMPLANT
SURGIFLO W/THROMBIN 8M KIT (HEMOSTASIS) IMPLANT
SUT BONE WAX W31G (SUTURE) ×1 IMPLANT
SUT MNCRL AB 3-0 PS2 27 (SUTURE) ×1 IMPLANT
SUT SILK 3-0 18XBRD TIE BLK (SUTURE) IMPLANT
SUT VIC AB 2-0 CT2 18 VCP726D (SUTURE) ×1 IMPLANT
SYR BULB IRRIG 60ML STRL (SYRINGE) ×1 IMPLANT
TAPE CLOTH 4X10 WHT NS (GAUZE/BANDAGES/DRESSINGS) ×1 IMPLANT
TAPE CLOTH SOFT 2X10 (GAUZE/BANDAGES/DRESSINGS) IMPLANT
TOWEL GREEN STERILE (TOWEL DISPOSABLE) ×1 IMPLANT
TOWEL GREEN STERILE FF (TOWEL DISPOSABLE) ×1 IMPLANT
TRAY FOLEY W/BAG SLVR 14FR LF (SET/KITS/TRAYS/PACK) IMPLANT
TRAY FOLEY W/BAG SLVR 16FR ST (SET/KITS/TRAYS/PACK) ×1 IMPLANT
TUBING FEATHERFLOW (TUBING) ×1 IMPLANT
WATER STERILE IRR 1000ML POUR (IV SOLUTION) ×1 IMPLANT

## 2024-03-31 NOTE — Anesthesia Procedure Notes (Signed)
 Procedure Name: Intubation Date/Time: 03/31/2024 12:10 PM  Performed by: Hershall Lory, CRNAPre-anesthesia Checklist: Patient identified, Emergency Drugs available, Suction available and Patient being monitored Patient Re-evaluated:Patient Re-evaluated prior to induction Oxygen Delivery Method: Circle system utilized Preoxygenation: Pre-oxygenation with 100% oxygen Induction Type: IV induction Ventilation: Mask ventilation without difficulty Laryngoscope Size: Glidescope Grade View: Grade I Tube type: Oral Tube size: 7.0 mm Number of attempts: 1 Airway Equipment and Method: Stylet and Oral airway Placement Confirmation: ETT inserted through vocal cords under direct vision, positive ETCO2 and breath sounds checked- equal and bilateral Secured at: 21 cm Tube secured with: Tape Dental Injury: Teeth and Oropharynx as per pre-operative assessment

## 2024-03-31 NOTE — Progress Notes (Signed)
 Orthopedic Surgery Post-operative Progress Note  Assessment: Patient is a 63 y.o. female who is currently admitted after undergoing C4/5 ACDF   Plan: -Operative plans complete -Needs upright films when able -Drain will be removed tomorrow morning -Out of bed as tolerated with aspen collar -No bending/lifting/twisting greater than 10 pounds -OT evaluation and treat -Pain control -Diabetic diet -Ancef  x2 post-operative doses -No antiplatelet or dvt chemoprophylaxis for 72 hours after surgery -Disposition: to floor from PACU  _________________________________________________________________________  Subjective: No acute events since surgery. Recovering in PACU. Pain well controlled.   Objective:  General: no acute distress, appropriate affect Neurologic: drowsy, following commands Respiratory: unlabored breathing on room air Neck: no hematoma appreciated, c collar in place Skin: dressing clear/dry/intact  MSK (spine):  -Strength exam      Right  Left Grip strength                5/5  5/5 Interosseus   5/5   5/5 Wrist extension  5/5  5/5 Wrist flexion   5/5  5/5 Elbow flexion   5/5  5/5 Deltoid    5/5  5/5  EHL    4/5  4/5 TA    5/5  5/5 GSC    5/5  5/5 Knee extension  5/5  5/5 Hip flexion   5/5  5/5  -Sensory exam    Sensation intact to light touch in L2-S1 nerve distributions of bilateral lower extremities  Sensation intact to light touch in C4-T1 nerve distributions of bilateral upper extremities   Patient name: Linda Beltran Patient MRN: [id number] Date: 03/31/24

## 2024-03-31 NOTE — Op Note (Signed)
 Orthopedic Spine Surgery Operative Report  Procedure: C4/5 anterior cervical discectomy and fusion C4/5 placement of biomechanical interbody spacer with demineralized bone matrix Intraoperative microscope use  Modifier: none  Date of procedure: 03/31/2024  Patient name: Linda Beltran MRN: [id number] DOB: [birth year]  Surgeon: Colette Davies, MD Assistant: none Pre-operative diagnosis: cervical spondylolytic myelopathy, adjacent segment disease Post-operative diagnosis: same as above Findings: C4/5 degenerative disc with anterior osteophyte  Specimens: none Anesthesia: general EBL: 75cc Complications: none Pre-incision antibiotic: ancef  Pre-incision decadron : 10mg  TXA given prior to incision as well  Implants:  Implant Name Type Inv. Item Serial No. Manufacturer Lot No. LRB No. Used Action  LOG [id number] - TITAN #508 CERVICAL IMPLANT SET - 1          [employer] - [id number] Bone Implant [employer] [id number] [employer]  N/A 1 Implanted  SPACER LORD COAL [id number] 7D - [id number] Spacer SPACER LORD [employer]AL [id number] 7D  [employer]  N/A 1 Implanted  SCREW SELF DRILL 3.6MM14MM - [id number] Screw SCREW SELF DRILL 3.6MM14MM  [employer]  N/A 2 Implanted      Indication for procedure: Patient is a 63 y.o. female who presented to the office with signs and symptoms consistent with cervical myelopathy. The natural history of cervical myelopathy was explained to the patient. After this discussion, surgical intervention was recommended. The pre-operative MRI showed stenosis at C4/5 so a C4/5 anterior cervical discectomy and fusion was presented as a treatment option. The risks, including but not limited to pseudarthrosis, dysphagia, hematoma, airway compromise, recurrent laryngeal nerve injury, esophageal perforation, durotomy, spinal cord injury, nerve root injury, persistent pain, adjacent segment disease, infection, bleeding, hardware failure, vascular  injury, heart attack, death, stroke, fracture, and need for additional procedures were discussed with the patient. The benefit of surgery would be preventing progression of the myelopathy and not to reverse any myelopathic symptoms. Explained that the patient may get some relief of radicular type pain, but they may not get full relief of their pain with this surgery, especially any neck pain. The alternatives to surgical management would be continued monitoring, physical therapy, over-the-counter pain medications, ambulatory aids, and activity modification. I reiterated that these modalities would not change the natural history of the disease and that is why operative management has been recommended. All the patient's questions were answered to her satisfaction. After this discussion, the patient expressed understanding and elected to proceed with surgical intervention.    Procedure Description: The patient was met in the pre-operative holding area. The patient's identity and consent were verified. The operative site was marked. The patient's remaining questions about the surgery were answered. The patient was brought back to the operating room. General anesthesia was induced and an endotracheal tube was placed by the anesthesia staff. The patient was transferred to the flat top [place] table in the supine position. All bony prominences were well padded. A bump was placed underneath the patient's shoulders to extend the neck slightly.  Neuromonitoring leads were attached to the patient by the neuromonitoring technologist. Baseline neuromonitoring signals were obtained. There was no change in the neuromonitoring signals after the weight was attached. The patient's shoulders were than taped to the bed to improve the fluoroscopic visualization during the procedure. The surgical area was cleansed with alcohol. Fluoroscopy was then brought in to check rotation on the AP image and to mark the levels on the lateral  image. The patient's skin was then prepped and draped in a standard, sterile fashion. A  time out was performed that identified the patient, the procedure, and the operative levels. All team members agreed with what was stated in the time out.   A left-sided transverse incision was made in the middle of the previously marked levels. Incision was taken sharply down through the skin and dermis. Electrocautery was used to dissect down to the level of the platysma. A Metzenbaum scissors was used to elevate flaps both cranially and caudally above the platysma. A small opening was made in the platysma with the Metzenbaum scissors. The scissors were then placed under the platysma and electrocautery was used on top of the scissors to split the platysma. Blunt dissection was carried out medial to the sternocleidomastoid to identify the omohyoid. The omohyoid was then dissected over its lateral aspect with the Metzenbaum scissors. An appendiceal retractor was placed around the carotid sheath to retract it laterally and a cloward retractor was placed medially to retractor the esophagus and trachea medially. The interval between these structures was then bluntly dissected with the Metzenbaum scissors. At this point, the prevertebral fascia was visible within the wound. A kittner was used to dissect the prevertebral fascia off the vertebral bodies and discs. A metallic sucker tip was placed over the disc thought to be the correct level. A lateral fluoroscopic shot was then used to confirm the C4/5 level. Once the correct level was identified, electrocautery was used to mark the disc space. The retractors that were previously placed were then put back into the wound.   Electrocautery was used to elevate the longus coli off the bone on each side. Shadowline retractor blades were then placed under the longus coli on each side and the self-retainer was attached to these retractor blades. The operative microscope was brought in to  improve visualization and lighting. A long handle fifteen blade was then used to incise the disc space along the endplates and longitudinally to connect these end plate incisions to create a rectangular incision in the disc. A pituitary was used to remove the incised disc material. A 2 kerrison was used to remove the anterior osteophyte and square the inferior endplate of the cranial vertebra. A combination of straight and curved curettes were used to remove further disc material and the cartilaginous aspect of the endplates. This was done until both the inferior and superior endplates had been prepared from the uncus to uncus and from ventral vertebral body to the posterior osteophytes. At this point, a burr was then used to remove the posterior vertebral osteophytes. Once the PLL was visualized within the wound, a nerve hook was used to develop the plane between the PLL and the dura. While lifting up the PLL with the nerve hook, a 1 kerrison was placed under the PLL and used to remove the PLL. Once some of the PLL had been removed, a combination of 1 and 2 kerrisons were used to continue removing the PLL until it had been completely removed. A curved curette was then placed into the foramen and pointed ventrally. It was used to remove soft tissue and the deep aspect of the uncus. A nerve hook was then easily passed into the foramen. The same process was repeated for the other foramen. Lateral fluoroscopic imaging was used to guide the insertion of trials into the disc space in a serial fashion, starting with the smallest trial. Once a good fit was obtained, the trial was left in and neuromonitoring signals were checked. There were no changes from baseline. AP and lateral fluoroscopic  images were obtained and confirmed satisfactory position of the trial. A 6 was selected for use as the final implant.  Demineralized bone matrix was packed into the interbody device on the back table. The interbody implant was then  placed into the prepared disc space and lightly malleted into place. Neuromonitoring signals were checked again and no changes from baseline were noted. AP and lateral fluoroscopic images was obtained and confirmed satisfactory position of the final implant.  The microscope was then moved away from the operative field and the retractor blades were removed from the wound. A cloward was then used to retract the esophagus and trachea away from the ventral vertebral bodies and another was used to retract the carotid and sternocleidomastoid laterally. An awl was placed into the hole within the cage that was aiming towards the C4 body. The awl was malleted to create a track for the screw. A 14mm screw was inserted into that hole. The same process was then repeated to insert a 14mm screw through the cage into the C5 vertebral body.    Final AP and lateral fluoroscopic films were taken and confirmed satisfactory position of the interbody implant and screws. The screws were final tightened. Hemostasis was achieved. A penrose drain was placed into the wound. The platysma was reapproximated using 2-0 vicryl. Neuromonitoring was checked and there were no changes from baseline. Neuromonitoring was discontinued at this time. The deep dermal layer was closed with 2-0 vicryl. The skin was closed with a 3-0 monocryl. All counts were correct at the end of the procedure. Benzoine and steri strips were used over the incision area. The wound was dressed with 4x4 gauze and paper tape. The patient was awakened from anesthesia and transferred to a bed where a hard cervical collar was applied. The patient was brought back to the post-anesthesia care unit in stable condition by the anesthesia staff.   Post-operative plan: The patient will recover in the post-anesthesia care unit and then go to the floor. The patient will receive two post-operative doses of ancef. She will get another dose of TXA. The patient will be out of bed as  tolerated in a cervical collar. The patient will work with physical therapy. The drain will be removed on post-operative day one. The patient will likely discharge to home tomorrow.        Colette Davies, MD Orthopedic Surgeon

## 2024-03-31 NOTE — Anesthesia Preprocedure Evaluation (Addendum)
 Anesthesia Evaluation  Patient identified by MRN, date of birth, ID band Patient awake    Reviewed: Allergy & Precautions, NPO status , Patient's Chart, lab work & pertinent test results, reviewed documented beta blocker date and time   Airway Mallampati: II  TM Distance: >3 FB Neck ROM: Full    Dental no notable dental hx.    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  Multiple sclerosis  Neuromuscular disease    GI/Hepatic ,GERD  ,,(+) neg Cirrhosis        Endo/Other  diabetes, Type 2    Renal/GU Renal disease     Musculoskeletal  (+) Arthritis ,    Abdominal   Peds  Hematology   Anesthesia Other Findings   Reproductive/Obstetrics                             Anesthesia Physical Anesthesia Plan  ASA: 3  Anesthesia Plan: General   Post-op Pain Management:    Induction: Intravenous  PONV Risk Score and Plan: 2 and Ondansetron  and Dexamethasone   Airway Management Planned: Video Laryngoscope Planned  Additional Equipment:   Intra-op Plan:   Post-operative Plan: Extubation in OR  Informed Consent: I have reviewed the patients History and Physical, chart, labs and discussed the procedure including the risks, benefits and alternatives for the proposed anesthesia with the patient or authorized representative who has indicated his/her understanding and acceptance.     Dental advisory given  Plan Discussed with: CRNA  Anesthesia Plan Comments: (SSEPS, MEPS - Remifentanil infusion)        Anesthesia Quick Evaluation

## 2024-03-31 NOTE — Progress Notes (Signed)
 Orthopedic Tech Progress Note Patient Details:  Linda Beltran 09/23/1961 [id number]  OR RN called requesting an [employer] COLLAR, dropped off [employer]   Patient ID: Linda Beltran, female   DOB: 1961-07-05, 63 y.o.   MRN: [id number]  Linda Beltran 03/31/2024, 5:28 PM

## 2024-03-31 NOTE — Transfer of Care (Signed)
 Immediate Anesthesia Transfer of Care Note  Patient: Linda Beltran  Procedure(s) Performed: ANTERIOR CERVICAL DECOMPRESSION/DISCECTOMY FUSION CERVICAL FOUR-FIVE  Patient Location: PACU  Anesthesia Type:General  Level of Consciousness: drowsy  Airway & Oxygen Therapy: Patient Spontanous Breathing and Patient connected to face mask oxygen  Post-op Assessment: Report given to RN and Post -op Vital signs reviewed and stable  Post vital signs: Reviewed and stable  Last Vitals:  Vitals Value Taken Time  BP 191/99 03/31/24 1754  Temp 36.9 C 03/31/24 1753  Pulse 106 03/31/24 1758  Resp 14 03/31/24 1758  SpO2 96 % 03/31/24 1758  Vitals shown include unfiled device data.  Last Pain:  Vitals:   03/31/24 1049  TempSrc:   PainSc: 0-No pain         Complications: No notable events documented.

## 2024-04-01 ENCOUNTER — Observation Stay (HOSPITAL_COMMUNITY)

## 2024-04-01 ENCOUNTER — Other Ambulatory Visit (HOSPITAL_COMMUNITY): Payer: Self-pay

## 2024-04-01 ENCOUNTER — Encounter (HOSPITAL_COMMUNITY): Payer: Self-pay | Admitting: Orthopedic Surgery

## 2024-04-01 DIAGNOSIS — Z981 Arthrodesis status: Secondary | ICD-10-CM | POA: Diagnosis not present

## 2024-04-01 DIAGNOSIS — M2578 Osteophyte, vertebrae: Secondary | ICD-10-CM | POA: Diagnosis not present

## 2024-04-01 DIAGNOSIS — E119 Type 2 diabetes mellitus without complications: Secondary | ICD-10-CM | POA: Diagnosis not present

## 2024-04-01 DIAGNOSIS — M4712 Other spondylosis with myelopathy, cervical region: Secondary | ICD-10-CM | POA: Diagnosis not present

## 2024-04-01 LAB — CBC
HCT: 37.4 % (ref 36.0–46.0)
Hemoglobin: 12.4 g/dL (ref 12.0–15.0)
MCH: 27.9 pg (ref 26.0–34.0)
MCHC: 33.2 g/dL (ref 30.0–36.0)
MCV: 84.2 fL (ref 80.0–100.0)
Platelets: 211 10*3/uL (ref 150–400)
RBC: 4.44 MIL/uL (ref 3.87–5.11)
RDW: 13.6 % (ref 11.5–15.5)
WBC: 9.3 10*3/uL (ref 4.0–10.5)
nRBC: 0 % (ref 0.0–0.2)

## 2024-04-01 LAB — BASIC METABOLIC PANEL WITH GFR
Anion gap: 9 (ref 5–15)
BUN: 5 mg/dL — ABNORMAL LOW (ref 8–23)
CO2: 25 mmol/L (ref 22–32)
Calcium: 9.5 mg/dL (ref 8.9–10.3)
Chloride: 101 mmol/L (ref 98–111)
Creatinine, Ser: 0.9 mg/dL (ref 0.44–1.00)
GFR, Estimated: >60 mL/min (ref >60)
Glucose, Bld: 162 mg/dL — ABNORMAL HIGH (ref 70–99)
Potassium: 4.2 mmol/L (ref 3.5–5.1)
Sodium: 135 mmol/L (ref 135–145)

## 2024-04-01 LAB — GLUCOSE, CAPILLARY: Glucose-Capillary: 151 mg/dL — ABNORMAL HIGH (ref 70–99)

## 2024-04-01 MED ORDER — LABETALOL HCL 5 MG/ML IV SOLN
10.0000 mg | Freq: Once | INTRAVENOUS | Status: AC
  Administered 2024-04-01: 10 mg via INTRAVENOUS

## 2024-04-01 MED ORDER — LABETALOL HCL 5 MG/ML IV SOLN
10.0000 mg | INTRAVENOUS | Status: DC | PRN
  Filled 2024-04-01: qty 4

## 2024-04-01 MED ORDER — METHOCARBAMOL 500 MG PO TABS
500.0000 mg | ORAL_TABLET | Freq: Four times a day (QID) | ORAL | 0 refills | Status: AC
  Filled 2024-04-01: qty 40, 10d supply, fill #0

## 2024-04-01 MED ORDER — OXYCODONE HCL 5 MG PO TABS
2.5000 mg | ORAL_TABLET | ORAL | 0 refills | Status: AC | PRN
  Filled 2024-04-01: qty 30, 5d supply, fill #0

## 2024-04-01 MED ORDER — SENNA 8.6 MG PO TABS
1.0000 | ORAL_TABLET | Freq: Two times a day (BID) | ORAL | 0 refills | Status: AC
  Filled 2024-04-01: qty 28, 14d supply, fill #0

## 2024-04-01 MED ORDER — POLYETHYLENE GLYCOL 3350 17 GM/SCOOP PO POWD
17.0000 g | Freq: Every day | ORAL | 0 refills | Status: AC
  Filled 2024-04-01: qty 238, 14d supply, fill #0

## 2024-04-01 MED ORDER — VALSARTAN 160 MG PO TABS: 160.0000 mg | ORAL_TABLET | Freq: Two times a day (BID) | ORAL | Status: AC

## 2024-04-01 NOTE — Plan of Care (Signed)
Pt and husband given D/C instructions with verbal understanding. Rx's were sent to the pharmacy by MD. Pt's incision is clean and dry with no sign of infection. Pt's IV and drain were removed prior to D/C. Pt D/C'd home via wheelchair per MD order. Pt is stable @ D/C and has no other needs at this time. Holli Humbles, RN

## 2024-04-01 NOTE — Evaluation (Signed)
 Occupational Therapy Evaluation/Discharge Patient Details Name: Linda Beltran MRN: [id number] DOB: 07-09-1961 Today's Date: 04/01/2024   History of Present Illness   63 y/o female with hx of cervical myelopathy.  MRI showed stenosis at C4/5 so a C4/5 anterior cervical discectomy and fusion was presented as a treatment option by Dr. Sulema Endo. Pt underwent C4/5 ACDF on 03/21/24. PMH: MS, DM, HTN, migraines.     Clinical Impressions Patient is s/p C4/5 ACDF surgery resulting in functional limitations due to the deficits listed below (see OT Problem List). All education has been completed during evaluation. Pt and daughter educated on collar wear, compensatory strategies for ADLs/mobility and cervical precautions. Pt verbalized and/or demonstrated understanding. Recommended use of RW at home initially to increase independence with ADL tasks. No follow up OT services recommended. Thank you for the referral. OT will sign off and follow up with surgeon when recommended.        If plan is discharge home, recommend the following:   A little help with walking and/or transfers;A little help with bathing/dressing/bathroom;Assistance with cooking/housework;Help with stairs or ramp for entrance;Assist for transportation     Functional Status Assessment   Patient has had a recent decline in their functional status and demonstrates the ability to make significant improvements in function in a reasonable and predictable amount of time.     Equipment Recommendations   None recommended by OT      Precautions/Restrictions   Precautions Precautions: Cervical Precaution Booklet Issued: Yes (comment) Recall of Precautions/Restrictions: Intact Precaution/Restrictions Comments: Pt and daughter educated on cervical precautions, compensatory techniques for ADL tasks, donning/doffing brace and cleaning/management of pads, bathing with shower collar or removal of pads from aspen collar. Pt and daughter  verbalized understanding. Required Braces or Orthoses: Cervical Brace Cervical Brace: Hard collar;At all times Restrictions Weight Bearing Restrictions Per Provider Order: No     Mobility Bed Mobility Overal bed mobility: Needs Assistance Bed Mobility: Supine to Sit, Sit to Supine     Supine to sit: Contact guard, HOB elevated Sit to supine: Contact guard assist, HOB elevated   General bed mobility comments: Pt reports that she has slept in her recliner in the past and plans to do so when returning home. HOB remained elevated to complete bed mobility. Pt educated on log roll technique and maintaining cervical precautions during bed mobility if using bed.    Transfers Overall transfer level: Needs assistance Equipment used: Rolling walker (2 wheels) Transfers: Sit to/from Stand, Bed to chair/wheelchair/BSC Sit to Stand: Supervision     Step pivot transfers: Supervision     General transfer comment: VC provided for hand placement during RW management prior to sit to stand technique. Education provided on control during transition and maintaining cervical precautions. Pt and daughter educated on navigating stairs in/out of home with both demonstrating understanding.      Balance Overall balance assessment: Mild deficits observed, not formally tested, History of Falls      ADL either performed or assessed with clinical judgement   ADL    General ADL Comments: Due to recent cervical surgery, pt may require assistance with LB ADL tasks which daughter and husband are able to provide.     Vision Baseline Vision/History: 0 No visual deficits Ability to See in Adequate Light: 0 Adequate Patient Visual Report: No change from baseline Vision Assessment?: No apparent visual deficits     Perception Perception: Not tested       Praxis Praxis: Not tested  Pertinent Vitals/Pain Pain Assessment Pain Assessment: 0-10 Pain Score: 4  Pain Location: right shoulder Pain  Descriptors / Indicators: Aching, Discomfort Pain Intervention(s): Repositioned, Monitored during session, Limited activity within patient's tolerance     Extremity/Trunk Assessment Upper Extremity Assessment Upper Extremity Assessment: Right hand dominant;Overall WFL for tasks assessed   Lower Extremity Assessment Lower Extremity Assessment: Overall WFL for tasks assessed   Cervical / Trunk Assessment Cervical / Trunk Assessment: Neck Surgery   Communication Communication Communication: No apparent difficulties   Cognition Arousal: Alert Behavior During Therapy: WFL for tasks assessed/performed Cognition: No apparent impairments    Following commands: Intact       Cueing  General Comments   Cueing Techniques: Verbal cues  BP elevated prior to OT eval. Pt with hx of HTN. Monitored pt during session and did not present with any adverse reactions.           Home Living Family/patient expects to be discharged to:: Private residence Living Arrangements: Spouse/significant other (and dog) Available Help at Discharge: Family;Available 24 hours/day (Daughter works from home and is able to help if needed) Type of Home: House Home Access: Stairs to enter [employer] of Steps: 4 Entrance Stairs-Rails: Right Home Layout: One level     Bathroom Shower/Tub: Chief Strategy Officer: Handicapped height     Home Equipment: Agricultural consultant (2 wheels);Cane - single point;Hand held shower head;Shower seat          Prior Functioning/Environment Prior Level of Function : Independent/Modified Independent       OT Problem List: Impaired balance (sitting and/or standing);Decreased strength;Pain        OT Goals(Current goals can be found in the care plan section)   Acute Rehab OT Goals Patient Stated Goal: to recover from surgery OT Goal Formulation: All assessment and education complete, DC therapy   OT Frequency:   1X visit       AM-PAC OT "6  Clicks" Daily Activity     Outcome Measure Help from another person eating meals?: None Help from another person taking care of personal grooming?: None Help from another person toileting, which includes using toliet, bedpan, or urinal?: A Little Help from another person bathing (including washing, rinsing, drying)?: A Little Help from another person to put on and taking off regular upper body clothing?: A Little Help from another person to put on and taking off regular lower body clothing?: A Little 6 Click Score: 20   End of Session Equipment Utilized During Treatment: Rolling walker (2 wheels);Cervical collar Nurse Communication: Mobility status  Activity Tolerance: Patient tolerated treatment well Patient left: in bed;with call bell/phone within reach;with family/visitor present  OT Visit Diagnosis: Unsteadiness on feet (R26.81);Muscle weakness (generalized) (M62.81)                Time: 2956-2130 OT Time Calculation (min): 28 min Charges:  OT General Charges $OT Visit: 1 Visit OT Evaluation $OT Eval Moderate Complexity: 1 Mod OT Treatments $Self Care/Home Management : 8-22 mins  Carollee Circle, OTR/L,CBIS  Supplemental OT - [facility] and [facility] Secure Chat Preferred    Delphina Schum, Ocie Belt 04/01/2024, 9:31 AM

## 2024-04-01 NOTE — Discharge Instructions (Addendum)
 Orthopedic Surgery Discharge Instructions  Patient name: Linda Beltran Procedure Performed: C4/5 anterior cervical discectomy and fusion Date of Surgery: 03/31/2024 Surgeon: Colette Davies, MD  Pre-operative Diagnosis: cervical spondylotic myelopathy Post-operative Diagnosis: same as above  Discharge Date: 04/01/2024 Discharged to: home Discharge Condition: stable  Activity: You should wear your cervical collar for six weeks after surgery. It is okay to remove the cervical collar when taking a shower, but you should have it on at all other times. You should refrain from bending, lifting, or twisting with objects greater than ten pounds until three months after surgery. You are encouraged to walk as much as desired. You can perform household activities such as cleaning dishes, doing laundry, vacuuming, etc. as long as the ten-pound restriction is followed.  Incision Care: Your incision site has a dressing over it. That dressing should remain in place and dry at all times for a total of one week after surgery. After one week, you can remove the dressing. Underneath the dressing, you will find pieces of tape. You should leave these pieces of tape in place. They will fall off with time. Do not pick, rub, or scrub at them. Do not put cream or lotion over the surgical area. After one week and once the dressing is off, it is okay to let soap and water run over your incision. Again, do not pick, scrub, or rub at the pieces of tape when bathing. Do not submerge (e.g., take a bath, swim, go in a hot tub, etc.) until six weeks after surgery. There may be some bloody drainage from the incision into the dressing after surgery. This is normal. You do not need to replace the dressing. Continue to leave it in place for the one week as instructed above. Should the dressing become saturated with blood or drainage, please call the office for further instructions.   Medications: You have been prescribed oxycodone . This  is a narcotic pain medication and should only be taken as prescribed. You should not drink alcohol or operate heavy machinery (including driving) while taking this medication. The oxycodone  can cause constipation as a side effect. For that reason, you have been prescribed senna and miralax . These are both laxatives. You do not need to take this medication if you develop diarrhea. Should you remain constipated even while taking these medications, please increase the dose of miralax  to twice daily. Robaxin is a muscle relaxer that has been prescribed to you for muscle spasm type pain. Take this medication as needed.   I have temporarily increased your valsartan  to twice per day. Your blood pressure was elevated during your admission. It looks like it has been elevated some time now. I would recommend scheduling an appointment with your primary care provider provider to discuss longer term control of your blood pressure.   Do not take NSAIDs (ibuprofen, Aleve, Celebrex, naproxen, meloxicam, etc.) for the first 6 weeks after surgery as there is some evidence that their use may decrease the chances of successful fusion.   In order to set expectations for opioid prescriptions, you will only be prescribed opioids for a total of six weeks after surgery and, at two-weeks after surgery, your opioid prescription will start to tapered (decreased dosage and number of pills). If you have ongoing need for opioid medication six weeks after surgery, you will be referred to pain management. If you are already established with a provider that is giving you opioid medications, you should schedule an appointment with them for six weeks  after surgery if you feel you are going to need another prescription. State law only allows for opioid prescriptions one week at a time. If you are running out of opioid medication near the end of the week, please call the office during business hours before running out so I can send you another  prescription.   You may resume any home blood thinners (aspirin, warfarin, lovenox , apixaban, plavix, xarelto, etc.) 72 hours after your surgery. Take these medications as they were previously prescribed.   Driving: You should not drive while taking narcotic pain medications. You should also not drive if you feel you cannot turn your body enough to check your blind spots while in your cervical collar. In this case, you should wait six weeks to drive when your cervical collar will be discontinued. You should start getting back to driving slowly and you may want to try driving in a parking lot before doing anything more. Please be mindful that in some states it is illegal to drive with a cervical collar in place. You should check your local laws before driving and you may have to wait until the collar is discontinued before driving.  Diet: You are safe to resume your regular diet. A common complication after cervical spine surgery is trouble swallowing especially if the incision was made over the front part of your neck. This complication happens often and, the vast majority of the time, it is a temporary problem that gets better with time. The first few days after surgery, you should take more bites than normal to break the food into smaller pieces before swallowing. With time as the swallowing becomes easier, you can gradually get back to taking bites like you normally would.   Reasons to Call the Office After Surgery: You should feel free to call the office with any concerns or questions you have in the post-operative period, but you should definitely notify the office if you develop: -shortness of breath, chest pain, or trouble breathing -excessive bleeding, drainage, redness, or swelling around the surgical site -fevers, chills, or pain that is getting worse with each passing day -persistent nausea or vomiting -new weakness in any extremity, new or worsening numbness or tingling in any  extremity -numbness in the groin, bowel or bladder incontinence -other concerns about your surgery  Follow Up Appointments: You should have an office appointment scheduled for approximately two weeks after surgery. If you do not remember when this appointment is or do not already have it scheduled, please call the office to schedule.   Office Information:  -Colette Davies, MD -Phone number: [phone] -Address: [address]       [place], [place] [address]

## 2024-04-01 NOTE — Anesthesia Postprocedure Evaluation (Signed)
 Anesthesia Post Note  Patient: Linda Beltran  Procedure(s) Performed: ANTERIOR CERVICAL DECOMPRESSION/DISCECTOMY FUSION CERVICAL FOUR-FIVE     Patient location during evaluation: PACU Anesthesia Type: General Level of consciousness: awake and alert Pain management: pain level controlled Vital Signs Assessment: post-procedure vital signs reviewed and stable Respiratory status: spontaneous breathing, nonlabored ventilation and respiratory function stable Cardiovascular status: blood pressure returned to baseline and stable Postop Assessment: no apparent nausea or vomiting Anesthetic complications: no   No notable events documented.  Last Vitals:  Vitals:   04/01/24 0007 04/01/24 0323  BP: (!) 162/86 (!) 177/96  Pulse: (!) 113 (!) 108  Resp:  17  Temp:  36.7 C  SpO2:  99%    Last Pain:  Vitals:   04/01/24 0547  TempSrc:   PainSc: Asleep                 Earvin Goldberg

## 2024-04-01 NOTE — Progress Notes (Addendum)
 Orthopedic Surgery Post-operative Progress Note  Assessment: Patient is a 63 y.o. female who is currently admitted after undergoing C4/5 ACDF   Plan: -Operative plans complete -Needs upright films when able -Drain removed this morning -Out of bed as tolerated with aspen collar -No bending/lifting/twisting greater than 10 pounds -OT evaluation and treat -Pain control -Diabetic diet -Ancef x2 post-operative doses -No antiplatelet or dvt chemoprophylaxis for 72 hours after surgery -Anticipate discharge to home today  _________________________________________________________________________  Subjective: No acute events overnight. Neck pain is well controlled with current medications. Having some right shoulder pain. No other extremity pain. Has a sore throat but has been able to eat dinner and breakfast with out issue. Denies paresthesias and numbness.   Objective:  General: no acute distress, appropriate affect, eating breakfast Neurologic: alert, answering questions appropriately, following commands Respiratory: unlabored breathing on room air Neck: no hematoma appreciated, c collar in place Skin: dressing clear/dry/intact  MSK (spine):  -Strength exam      Right  Left Grip strength                5/5  5/5 Interosseus   5/5   5/5 Wrist extension  5/5  5/5 Wrist flexion   5/5  5/5 Elbow flexion   5/5  5/5 Deltoid    5/5  5/5  EHL    4/5  4/5 TA    5/5  5/5 GSC    5/5  5/5 Knee extension  5/5  5/5 Hip flexion   5/5  5/5  -Sensory exam    Sensation intact to light touch in L2-S1 nerve distributions of bilateral lower extremities  Sensation intact to light touch in C4-T1 nerve distributions of bilateral upper extremities   Patient name: Linda Beltran Patient MRN: [id number] Date: 04/01/24

## 2024-04-01 NOTE — Discharge Summary (Addendum)
 Orthopedic Surgery Discharge Summary  Patient name: Linda Beltran Patient MRN: [id number] Admit today: 03/31/2024 Discharge date: 04/01/24  Attending physician: Colette Davies, MD Final diagnosis: cervical spondylotic myelopathy Findings: C4/5 degenerative disc with anterior osteophyte   Hospital course: Patient is a 63 y.o. female who was admitted after undergoing C4/5 anterior cervical discectomy and fusion. The patient had significant pain immediately after surgery, but pain eventually was controlled with a multimodal regimen including oxycodone . Labs during the hospitalization revealed no significant anemia or electrolyte abnormalities. The patient worked with physical therapy who recommended discharge to home. The patient was tolerating an oral diet without issue and was voiding spontaneously after surgery. The patient's vitals were stable on the day of discharge. The patient's drain was removed on the day of discharge. The patient was medically ready for discharge and was discharge to home on post-operative day one.  Instructions:   Orthopedic Surgery Discharge Instructions   Patient name: Linda Beltran Procedure Performed: C4/5 anterior cervical discectomy and fusion Date of Surgery: 03/31/2024 Surgeon: Colette Davies, MD   Pre-operative Diagnosis: cervical spondylotic myelopathy Post-operative Diagnosis: same as above   Discharge Date: 04/01/2024 Discharged to: home Discharge Condition: stable   Activity: You should wear your cervical collar for six weeks after surgery. It is okay to remove the cervical collar when taking a shower, but you should have it on at all other times. You should refrain from bending, lifting, or twisting with objects greater than ten pounds until three months after surgery. You are encouraged to walk as much as desired. You can perform household activities such as cleaning dishes, doing laundry, vacuuming, etc. as long as the ten-pound restriction is  followed.   Incision Care: Your incision site has a dressing over it. That dressing should remain in place and dry at all times for a total of one week after surgery. After one week, you can remove the dressing. Underneath the dressing, you will find pieces of tape. You should leave these pieces of tape in place. They will fall off with time. Do not pick, rub, or scrub at them. Do not put cream or lotion over the surgical area. After one week and once the dressing is off, it is okay to let soap and water run over your incision. Again, do not pick, scrub, or rub at the pieces of tape when bathing. Do not submerge (e.g., take a bath, swim, go in a hot tub, etc.) until six weeks after surgery. There may be some bloody drainage from the incision into the dressing after surgery. This is normal. You do not need to replace the dressing. Continue to leave it in place for the one week as instructed above. Should the dressing become saturated with blood or drainage, please call the office for further instructions.    Medications: You have been prescribed oxycodone . This is a narcotic pain medication and should only be taken as prescribed. You should not drink alcohol or operate heavy machinery (including driving) while taking this medication. The oxycodone  can cause constipation as a side effect. For that reason, you have been prescribed senna and miralax . These are both laxatives. You do not need to take this medication if you develop diarrhea. Should you remain constipated even while taking these medications, please increase the dose of miralax  to twice daily. Robaxin is a muscle relaxer that has been prescribed to you for muscle spasm type pain. Take this medication as needed.    I have temporarily increased your valsartan  to  twice per day. Your blood pressure was elevated during your admission. It looks like it has been elevated some time now. I would recommend scheduling an appointment with your primary care  provider provider to discuss longer term control of your blood pressure.    Do not take NSAIDs (ibuprofen, Aleve, Celebrex, naproxen, meloxicam, etc.) for the first 6 weeks after surgery as there is some evidence that their use may decrease the chances of successful fusion.    In order to set expectations for opioid prescriptions, you will only be prescribed opioids for a total of six weeks after surgery and, at two-weeks after surgery, your opioid prescription will start to tapered (decreased dosage and number of pills). If you have ongoing need for opioid medication six weeks after surgery, you will be referred to pain management. If you are already established with a provider that is giving you opioid medications, you should schedule an appointment with them for six weeks after surgery if you feel you are going to need another prescription. State law only allows for opioid prescriptions one week at a time. If you are running out of opioid medication near the end of the week, please call the office during business hours before running out so I can send you another prescription.    You may resume any home blood thinners (aspirin, warfarin, lovenox , apixaban, plavix, xarelto, etc.) 72 hours after your surgery. Take these medications as they were previously prescribed.    Driving: You should not drive while taking narcotic pain medications. You should also not drive if you feel you cannot turn your body enough to check your blind spots while in your cervical collar. In this case, you should wait six weeks to drive when your cervical collar will be discontinued. You should start getting back to driving slowly and you may want to try driving in a parking lot before doing anything more. Please be mindful that in some states it is illegal to drive with a cervical collar in place. You should check your local laws before driving and you may have to wait until the collar is discontinued before driving.   Diet: You  are safe to resume your regular diet. A common complication after cervical spine surgery is trouble swallowing especially if the incision was made over the front part of your neck. This complication happens often and, the vast majority of the time, it is a temporary problem that gets better with time. The first few days after surgery, you should take more bites than normal to break the food into smaller pieces before swallowing. With time as the swallowing becomes easier, you can gradually get back to taking bites like you normally would.    Reasons to Call the Office After Surgery: You should feel free to call the office with any concerns or questions you have in the post-operative period, but you should definitely notify the office if you develop: -shortness of breath, chest pain, or trouble breathing -excessive bleeding, drainage, redness, or swelling around the surgical site -fevers, chills, or pain that is getting worse with each passing day -persistent nausea or vomiting -new weakness in any extremity, new or worsening numbness or tingling in any extremity -numbness in the groin, bowel or bladder incontinence -other concerns about your surgery   Follow Up Appointments: You should have an office appointment scheduled for approximately two weeks after surgery. If you do not remember when this appointment is or do not already have it scheduled, please call the office  to schedule.    Office Information:  -Colette Davies, MD -Phone number: [phone] -Address: [address]                  [place], [place] [address]

## 2024-04-15 ENCOUNTER — Other Ambulatory Visit (INDEPENDENT_AMBULATORY_CARE_PROVIDER_SITE_OTHER)

## 2024-04-15 ENCOUNTER — Ambulatory Visit (INDEPENDENT_AMBULATORY_CARE_PROVIDER_SITE_OTHER): Admitting: Orthopedic Surgery

## 2024-04-15 DIAGNOSIS — Z981 Arthrodesis status: Secondary | ICD-10-CM | POA: Diagnosis not present

## 2024-04-15 DIAGNOSIS — M542 Cervicalgia: Secondary | ICD-10-CM

## 2024-04-15 NOTE — Progress Notes (Signed)
 Orthopedic Surgery Post-operative Office Visit  Procedure: C4/5 ACDF Date of Surgery: 03/31/2024 (~2 weeks post-op)  Assessment: Patient is a 63 y.o. female who is has noticed some improvement in her hand function and balance since surgery and her pain has been improving with time   Plan: -Operative plans complete -Out of bed as tolerated, aspen collar -No bending/lifting/twisting greater than 10 pounds -Okay to let soap/water run over the incision, but do not submerge -Pain management: tylenol  -Return to office in 4 weeks, spine films needed at next visit: AP/lateral cervical  ___________________________________________________________________________   Subjective: Patient has been at home since discharge from the hospital.  She has been wearing her cervical collar.  Her neck pain has been gradually getting better with time.  He is taking Tylenol  to control her pain.  She has noticed improvement in her balance and hand function.  She still uses a cane to ambulate.  Has not noticed any redness or drainage around her incision.  Objective:  General: no acute distress, appropriate affect Neurologic: alert, answering questions appropriately, following commands Respiratory: unlabored breathing on room air Skin: incision is well-approximated with no erythema, induration, active/expressible drainage  MSK (spine):  -Strength exam      Left  Right Grip strength                5/5  5/5 Interosseus   5/5   5/5 Wrist extension  5/5  5/5 Wrist flexion   5/5  5/5 Elbow flexion   5/5  5/5 Deltoid    5/5  5/5  EHL    4/5  4/5 TA    5/5  5/5 GSC    5/5  5/5 Knee extension  5/5  5/5 Hip flexion   5/5  5/5  -Sensory exam    Sensation intact to light touch in L2-S1 nerve distributions of bilateral lower extremities  Sensation intact to light touch in C4-T1 nerve distributions of bilateral upper extremities   Imaging: X-rays of the cervical spine from 04/15/2024 were independently  reviewed and interpreted, showing stand-alone interbody device at C4/5.  No lucency seen around the screws.  Cage position appears unchanged since films on 04/01/2024.  There is a plate spanning from C5-7.  No lucency seen around the screws associated with the plate.  There appears to be bridging bone across the former disc spaces from C5-7.   Patient name: Linda Beltran Patient MRN: [id number] Date of visit: 04/15/24

## 2024-05-06 ENCOUNTER — Telehealth: Payer: Self-pay | Admitting: Radiology

## 2024-05-06 ENCOUNTER — Encounter: Payer: Self-pay | Admitting: Radiology

## 2024-05-06 NOTE — Telephone Encounter (Signed)
 Note put in letter form and printed and faxed to Lawyer.

## 2024-05-06 NOTE — Telephone Encounter (Signed)
-----   Message from Linda DELENA Beltran sent at 05/05/2024  9:23 PM EDT ----- Below is a letter that I wrote for [employer] lawyer. There is a fax number [phone] to send it to. I have a copy of the letter as the request from the lawyer if you need it.     To whom it may concern,   Ms. Linda Beltran is a patient of mine who developed cervical myelopathy. This is a condition that involves stepwise decline in function. Typically, it progressives over time and patients will lose the ability to perform fine motor skills with their hands. They can also develop unsteadiness and imbalance. If left untreated, it can result in severe functional deficits. In Linda Beltran case, she also has a history of multiple sclerosis. Her neurologist evaluated her and identified the constellation of symptoms that were consistent with cervical myelopathy, so she was referred to my office. She had developed unsteadiness with gait and had to start using a cane. She noted difficulty with fine motor skills in the hands. She has had difficulty with everyday tasks like fastening bras, zipping up dresses, and even has noticed trouble with handwriting. On her evaluation with me, she had multiple physical exam findings that pointed towards cervical myelopathy including hyperreflexia in her biceps, a positive Romberg, instability with tandem gait, and a positive Hoffman. Her MRI was consistent with the diagnosis of cervical myelopathy with central stenosis and T2 cord signal change at C4/5. Since there are no known non-operative treatments to help with this issue, she underwent surgical intervention to prevent progression of the disease. Unfortunately, surgery does not typically reverse the symptoms but prevents their progression. Linda Beltran has been unable to work because of her unsteadiness and difficulty with fine motor skills in the hands. Since most patients do not notice any reversal of their symptoms after surgery, I anticipate her  current decreased ability to perform fine motor skills with the hands and unsteadiness will be ongoing and permanent. She will need workplace accommodations that allow her to use a cane to support her unsteadiness. She should not have to two-hand lift objects or do any tasks that require use of both of her hands while standing since she will need to have a cane for her unsteadiness. She also should be given extra time for tasks that require fine motor skills of the hands, such as typing or handwriting. If she can be reasonably be accommodated with these restrictions, I anticipate her being able to return to work around 3 months after surgery (around 07/01/2024). Her final result and recovery from surgery will be at one year from date of surgery (03/31/2025). As an orthopedic spine surgeon, I can comment on her cervical myelopathy but cannot comment on any of her other medical conditions. Hopefully this information is helpful.   Linda Ada, MD Orthopedic Surgeon 05/05/2024   [facility] [address] [place], [place] [address] [phone]

## 2024-05-18 DIAGNOSIS — I10 Essential (primary) hypertension: Secondary | ICD-10-CM | POA: Diagnosis not present

## 2024-05-20 ENCOUNTER — Other Ambulatory Visit (INDEPENDENT_AMBULATORY_CARE_PROVIDER_SITE_OTHER): Payer: Self-pay

## 2024-05-20 ENCOUNTER — Ambulatory Visit (INDEPENDENT_AMBULATORY_CARE_PROVIDER_SITE_OTHER): Admitting: Orthopedic Surgery

## 2024-05-20 DIAGNOSIS — Z981 Arthrodesis status: Secondary | ICD-10-CM

## 2024-05-20 NOTE — Progress Notes (Signed)
 Orthopedic Surgery Post-operative Office Visit   Procedure: C4/5 ACDF Date of Surgery: 03/31/2024 (~6 weeks post-op)   Assessment: Patient is a 63 y.o. female who is has noticed further improvement in her hand function and balance since surgery     Plan: -Operative plans complete -Out of bed as tolerated, can wean out of cervical collar -No bending/lifting/twisting greater than 10 pounds -Okay to let soap/water run over the incision, but do not submerge -Pain management: tylenol  as needed -Return to office in 6 weeks, spine films needed at next visit: AP/lateral/flex/ex cervical   ___________________________________________________________________________     Subjective: Patient has been doing well since she was last seen.  She has noticed further improvement in her balance and fine motor skills with the hands.  She has been using a cane to ambulate.  She does have neck pain but it is better than her last visit.  She has no pain radiating into either upper extremity.  She has not noticed any redness or drainage around her incision.  She is not having any significant difficulty with swallowing.   Objective:   General: no acute distress, appropriate affect Neurologic: alert, answering questions appropriately, following commands Respiratory: unlabored breathing on room air Skin: incision is well healed with no erythema, induration, active/expressible drainage   MSK (spine):   -Strength exam                                                   Left                  Right Grip strength                5/5                  5/5 Interosseus                  5/5                  5/5 Wrist extension            5/5                  5/5 Wrist flexion                 5/5                  5/5 Elbow flexion                5/5                  5/5 Deltoid                          5/5                  5/5   EHL                              4/5                  4/5 TA  5/5                  5/5 GSC                             5/5                  5/5 Knee extension            5/5                  5/5 Hip flexion                    5/5                  5/5   -Sensory exam                           Sensation intact to light touch in L2-S1 nerve distributions of bilateral lower extremities             Sensation intact to light touch in C4-T1 nerve distributions of bilateral upper extremities     Imaging: X-rays of the cervical spine from 05/20/2024 were independently reviewed and interpreted, showing prior C5-7 anterior instrumentation.  No lucency seen around the screws.  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  The interbody device at C4/5 appears in similar position to immediate postoperative films on 04/01/2024.  No lucency is seen around the integrated screws within the interbody device.  No fracture or dislocation seen.    Patient name: Linda Beltran Patient MRN: [id number] Date of visit: 05/20/24

## 2024-05-25 DIAGNOSIS — K648 Other hemorrhoids: Secondary | ICD-10-CM | POA: Diagnosis not present

## 2024-05-25 DIAGNOSIS — K222 Esophageal obstruction: Secondary | ICD-10-CM | POA: Diagnosis not present

## 2024-05-25 DIAGNOSIS — K2970 Gastritis, unspecified, without bleeding: Secondary | ICD-10-CM | POA: Diagnosis not present

## 2024-05-25 DIAGNOSIS — K2289 Other specified disease of esophagus: Secondary | ICD-10-CM | POA: Diagnosis not present

## 2024-05-25 DIAGNOSIS — R1310 Dysphagia, unspecified: Secondary | ICD-10-CM | POA: Diagnosis not present

## 2024-05-25 DIAGNOSIS — K3189 Other diseases of stomach and duodenum: Secondary | ICD-10-CM | POA: Diagnosis not present

## 2024-05-25 DIAGNOSIS — Z1211 Encounter for screening for malignant neoplasm of colon: Secondary | ICD-10-CM | POA: Diagnosis not present

## 2024-05-26 DIAGNOSIS — G43119 Migraine with aura, intractable, without status migrainosus: Secondary | ICD-10-CM | POA: Diagnosis not present

## 2024-05-26 DIAGNOSIS — G35 Multiple sclerosis: Secondary | ICD-10-CM | POA: Diagnosis not present

## 2024-06-01 DIAGNOSIS — G35 Multiple sclerosis: Secondary | ICD-10-CM | POA: Diagnosis not present

## 2024-06-05 DIAGNOSIS — F32A Depression, unspecified: Secondary | ICD-10-CM | POA: Diagnosis not present

## 2024-06-05 DIAGNOSIS — F419 Anxiety disorder, unspecified: Secondary | ICD-10-CM | POA: Diagnosis not present

## 2024-06-17 DIAGNOSIS — F419 Anxiety disorder, unspecified: Secondary | ICD-10-CM | POA: Diagnosis not present

## 2024-06-17 DIAGNOSIS — F32A Depression, unspecified: Secondary | ICD-10-CM | POA: Diagnosis not present

## 2024-07-01 ENCOUNTER — Other Ambulatory Visit (INDEPENDENT_AMBULATORY_CARE_PROVIDER_SITE_OTHER): Payer: Self-pay

## 2024-07-01 ENCOUNTER — Ambulatory Visit (INDEPENDENT_AMBULATORY_CARE_PROVIDER_SITE_OTHER): Admitting: Orthopedic Surgery

## 2024-07-01 DIAGNOSIS — Z981 Arthrodesis status: Secondary | ICD-10-CM

## 2024-07-01 NOTE — Progress Notes (Signed)
 Orthopedic Surgery Post-operative Office Visit   Procedure: C4/5 ACDF Date of Surgery: 03/31/2024 (~3 months post-op)   Assessment: Patient is a 63 y.o. female who has some pain in her neck and trapezius. 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  She has not noticed any worsening of her balance or hand function.  She still feels that her balance and ability to do fine motor skills with her hands has improved since surgery but has not the same as it was several years ago.  She feels more comfortable now she is out of the cervical collar.   Objective:   General: no acute distress, appropriate affect Neurologic: alert, answering questions appropriately, following commands Respiratory: unlabored breathing on room air Skin: incision is well healed   MSK (spine):   -Strength exam                                                   Left                  Right Grip strength                5/5                  5/5 Interosseus                  5/5                  5/5 Wrist extension            5/5                  5/5 Wrist flexion                 5/5                  5/5 Elbow flexion                5/5                  5/5 Deltoid                          5/5                  5/5   EHL                              4/5                  4/5 TA                                 5/5                  5/5 GSC  5/5                  5/5 Knee extension            5/5                  5/5 Hip flexion                     5/5                  5/5   -Sensory exam                           Sensation intact to light touch in L2-S1 nerve distributions of bilateral lower extremities             Sensation intact to light touch in C4-T1 nerve distributions of bilateral upper extremities     Imaging: XRs of the cervical spine from 07/01/2024 were independent reviewed and interpreted, showing stand-alone interbody device at C4/5.  Interbody device appears in appropriate position.  There is been no change in the position since immediate postoperative films on 04/01/2024.  There is anterior instrumentation from C5-7.  No lucency seen around the screws.  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  No lucency seen around the stand-alone interbody device or the integrated screws.  No fracture or dislocation seen.     Patient name: Linda Beltran Patient MRN: [id number] Date of visit: 07/01/24

## 2024-07-14 DIAGNOSIS — Z780 Asymptomatic menopausal state: Secondary | ICD-10-CM | POA: Diagnosis not present

## 2024-07-14 DIAGNOSIS — M79642 Pain in left hand: Secondary | ICD-10-CM | POA: Diagnosis not present

## 2024-07-14 DIAGNOSIS — M79641 Pain in right hand: Secondary | ICD-10-CM | POA: Diagnosis not present

## 2024-07-29 DIAGNOSIS — G35 Multiple sclerosis: Secondary | ICD-10-CM | POA: Diagnosis not present

## 2024-08-13 ENCOUNTER — Telehealth: Payer: Self-pay | Admitting: Orthopedic Surgery

## 2024-08-24 DIAGNOSIS — G35B0 Primary progressive multiple sclerosis, unspecified: Secondary | ICD-10-CM | POA: Diagnosis not present

## 2024-08-24 DIAGNOSIS — K5909 Other constipation: Secondary | ICD-10-CM | POA: Diagnosis not present

## 2024-08-24 DIAGNOSIS — F3289 Other specified depressive episodes: Secondary | ICD-10-CM | POA: Diagnosis not present

## 2024-08-24 DIAGNOSIS — R630 Anorexia: Secondary | ICD-10-CM | POA: Diagnosis not present

## 2024-08-25 ENCOUNTER — Telehealth: Payer: Self-pay | Admitting: Orthopedic Surgery

## 2024-09-07 ENCOUNTER — Encounter: Payer: Self-pay | Admitting: Radiology

## 2024-09-30 ENCOUNTER — Ambulatory Visit: Admitting: Orthopedic Surgery

## 2024-10-06 DIAGNOSIS — E785 Hyperlipidemia, unspecified: Secondary | ICD-10-CM | POA: Diagnosis not present

## 2024-10-06 DIAGNOSIS — R262 Difficulty in walking, not elsewhere classified: Secondary | ICD-10-CM | POA: Diagnosis not present

## 2024-10-06 DIAGNOSIS — E1165 Type 2 diabetes mellitus with hyperglycemia: Secondary | ICD-10-CM | POA: Diagnosis not present

## 2024-10-06 DIAGNOSIS — I10 Essential (primary) hypertension: Secondary | ICD-10-CM | POA: Diagnosis not present

## 2024-10-06 DIAGNOSIS — Z0000 Encounter for general adult medical examination without abnormal findings: Secondary | ICD-10-CM | POA: Diagnosis not present

## 2024-10-26 ENCOUNTER — Other Ambulatory Visit: Payer: Self-pay | Admitting: Family Medicine

## 2024-10-26 DIAGNOSIS — Z1231 Encounter for screening mammogram for malignant neoplasm of breast: Secondary | ICD-10-CM

## 2024-11-13 ENCOUNTER — Other Ambulatory Visit: Payer: Self-pay | Admitting: Medical Genetics

## 2024-11-19 ENCOUNTER — Ambulatory Visit
Admission: RE | Admit: 2024-11-19 | Discharge: 2024-11-19 | Disposition: A | Source: Ambulatory Visit | Attending: Family Medicine | Admitting: Family Medicine

## 2024-11-19 DIAGNOSIS — Z1231 Encounter for screening mammogram for malignant neoplasm of breast: Secondary | ICD-10-CM

## 2024-12-04 ENCOUNTER — Other Ambulatory Visit (HOSPITAL_COMMUNITY)

## 2025-01-05 ENCOUNTER — Other Ambulatory Visit (HOSPITAL_COMMUNITY)
